# Patient Record
Sex: Female | Born: 1955
Health system: Southern US, Community
[De-identification: ages and names within clinical notes are randomized; demographics above are authoritative.]

## PROBLEM LIST (undated history)

## (undated) DIAGNOSIS — G43909 Migraine, unspecified, not intractable, without status migrainosus: Secondary | ICD-10-CM

## (undated) DIAGNOSIS — D219 Benign neoplasm of connective and other soft tissue, unspecified: Secondary | ICD-10-CM

## (undated) DIAGNOSIS — K5790 Diverticulosis of intestine, part unspecified, without perforation or abscess without bleeding: Secondary | ICD-10-CM

## (undated) DIAGNOSIS — M1990 Unspecified osteoarthritis, unspecified site: Secondary | ICD-10-CM

## (undated) HISTORY — DX: Unspecified osteoarthritis, unspecified site: M19.90

## (undated) HISTORY — DX: Benign neoplasm of connective and other soft tissue, unspecified: D21.9

## (undated) HISTORY — PX: INCISION AND DRAINAGE: SHX5863

## (undated) HISTORY — DX: Diverticulosis of intestine, part unspecified, without perforation or abscess without bleeding: K57.90

## (undated) HISTORY — DX: Migraine, unspecified, not intractable, without status migrainosus: G43.909

---

## 1990-11-05 HISTORY — PX: ECTOPIC PREGNANCY SURGERY: SHX613

## 1991-01-04 HISTORY — PX: HYSTEROSALPINGOGRAM: SHX6581

## 1997-02-03 HISTORY — PX: ABDOMINAL HYSTERECTOMY: SHX81

## 2005-06-19 ENCOUNTER — Ambulatory Visit: Payer: Self-pay | Admitting: Unknown Physician Specialty

## 2006-06-21 ENCOUNTER — Ambulatory Visit: Payer: Self-pay | Admitting: Unknown Physician Specialty

## 2007-06-24 ENCOUNTER — Ambulatory Visit: Payer: Self-pay | Admitting: Unknown Physician Specialty

## 2008-06-25 ENCOUNTER — Ambulatory Visit: Payer: Self-pay | Admitting: Unknown Physician Specialty

## 2009-07-27 ENCOUNTER — Ambulatory Visit: Payer: Self-pay | Admitting: Unknown Physician Specialty

## 2010-07-31 ENCOUNTER — Ambulatory Visit: Payer: Self-pay | Admitting: Unknown Physician Specialty

## 2010-09-01 ENCOUNTER — Ambulatory Visit: Payer: Self-pay | Admitting: Gastroenterology

## 2010-09-01 HISTORY — PX: ESOPHAGOGASTRODUODENOSCOPY: SHX1529

## 2010-09-01 HISTORY — PX: COLONOSCOPY: SHX174

## 2011-08-09 ENCOUNTER — Ambulatory Visit: Payer: Self-pay | Admitting: Unknown Physician Specialty

## 2013-08-19 ENCOUNTER — Ambulatory Visit: Payer: Self-pay

## 2016-08-08 ENCOUNTER — Other Ambulatory Visit: Payer: Self-pay | Admitting: Certified Nurse Midwife

## 2016-08-08 DIAGNOSIS — Z1231 Encounter for screening mammogram for malignant neoplasm of breast: Secondary | ICD-10-CM

## 2016-09-18 ENCOUNTER — Ambulatory Visit
Admission: RE | Admit: 2016-09-18 | Discharge: 2016-09-18 | Disposition: A | Discharge status: Home / Self Care | Payer: 59 | Source: Ambulatory Visit | Attending: Certified Nurse Midwife | Admitting: Certified Nurse Midwife

## 2016-09-18 DIAGNOSIS — Z1231 Encounter for screening mammogram for malignant neoplasm of breast: Secondary | ICD-10-CM | POA: Diagnosis not present

## 2017-08-05 DIAGNOSIS — M179 Osteoarthritis of knee, unspecified: Secondary | ICD-10-CM | POA: Insufficient documentation

## 2017-08-20 ENCOUNTER — Other Ambulatory Visit: Payer: Self-pay | Admitting: Certified Nurse Midwife

## 2017-08-20 DIAGNOSIS — Z1231 Encounter for screening mammogram for malignant neoplasm of breast: Secondary | ICD-10-CM

## 2017-10-02 ENCOUNTER — Encounter: Payer: Self-pay | Admitting: Certified Nurse Midwife

## 2017-10-02 ENCOUNTER — Ambulatory Visit (INDEPENDENT_AMBULATORY_CARE_PROVIDER_SITE_OTHER): Payer: 59 | Admitting: Certified Nurse Midwife

## 2017-10-02 ENCOUNTER — Ambulatory Visit
Admission: RE | Admit: 2017-10-02 | Discharge: 2017-10-02 | Disposition: A | Discharge status: Home / Self Care | Payer: 59 | Source: Ambulatory Visit | Attending: Certified Nurse Midwife | Admitting: Certified Nurse Midwife

## 2017-10-02 VITALS — BP 110/70 | HR 68 | Ht 62.0 in | Wt 146.0 lb

## 2017-10-02 DIAGNOSIS — Z1239 Encounter for other screening for malignant neoplasm of breast: Secondary | ICD-10-CM

## 2017-10-02 DIAGNOSIS — Z01419 Encounter for gynecological examination (general) (routine) without abnormal findings: Secondary | ICD-10-CM | POA: Diagnosis not present

## 2017-10-02 DIAGNOSIS — Z1231 Encounter for screening mammogram for malignant neoplasm of breast: Secondary | ICD-10-CM

## 2017-10-02 NOTE — Progress Notes (Signed)
Gynecology Annual Exam  PCP: Patient, No Pcp Per  Chief Complaint:  Chief Complaint  Patient presents with  . Gynecologic Exam    History of Present Illness:Kaitlyn Forbes is a 61 year old African American/Black female, [place], who presents for her annual exam. She is having no significant GYN problems.  Her menses are absent due to hysterectomy (TAH for fibroids).  She has had no spotting.   The patient's past medical history is detailed in the past medical history section.  Since her last annual GYN exam dated 09/18/2016 , she has had some problems with pain in her right knee from osteoarthritis and has stopped running for the time being. She has gained a few pounds back and her current BMI=26.70 kg/m2. Her father passed 07/08/2017 from COPD. She is sexually active. She does not have vaginal dryness.   Her most recent  mammogram obtained  09/18/2016 was negative. There is no family history of breast cancer.  There is no family history of ovarian cancer.  The patient does do monthly self breast exams.  She had a colonoscopy in 2011 that was normal. Her next colonoscopy is due in 10 years.  She had a recent DEXA scan obtained in 2014 that was normal.  The patient does not smoke.  The patient does drink occasionally.  The patient does not use illegal drugs.  The patient normally exercises regularly, but she has not exercised recently due to knee pain The patient does get adequate calcium in her diet and with her supplement  She had a recent cholesterol screen in 2018 at work  that was normal.    The patient denies current symptoms of depression.    Review of Systems: Review of Systems  Constitutional: Negative for chills, fever and weight loss.  HENT: Negative for congestion, sinus pain and sore throat.   Eyes: Negative for blurred vision and pain.  Respiratory: Negative for hemoptysis, shortness of breath and wheezing.   Cardiovascular: Negative for chest pain,  palpitations and leg swelling.  Gastrointestinal: Negative for abdominal pain, blood in stool, diarrhea, heartburn, nausea and vomiting.  Genitourinary: Negative for dysuria, frequency, hematuria and urgency.  Musculoskeletal: Positive for joint pain (knee pain). Negative for back pain and myalgias.  Skin: Negative for itching and rash.  Neurological: Negative for dizziness, tingling and headaches.  Endo/Heme/Allergies: Negative for environmental allergies and polydipsia. Does not bruise/bleed easily.       Negative for hirsutism   Psychiatric/Behavioral: Negative for depression. The patient is not nervous/anxious and does not have insomnia.     Past Medical History:  Past Medical History:  Diagnosis Date  . Fibroids   . Osteoarthritis    right knee    Past Surgical History:  Past Surgical History:  Procedure Laterality Date  . ABDOMINAL HYSTERECTOMY  02/1997   TAH for fibroids/ Dr Rayford Halsted  . COLONOSCOPY  09/01/2010   diverticulosis  . ECTOPIC PREGNANCY SURGERY Left 11/1990   with left salpingectomy  . ESOPHAGOGASTRODUODENOSCOPY  09/01/2010   Dr Donnella Sham. Prior EGD for anemia work up by Dr Nicolasa Ducking.  Marland Kitchen HYSTEROSALPINGOGRAM  01/1991   distal obstruction of right Fallopian tube  . INCISION AND DRAINAGE     left bartholin cyst/ Word catheter    Family History:  Family History  Problem Relation Age of Onset  . Hypertension Mother   . Diabetes Father   . COPD Father   . Breast cancer Neg Hx     Social History:  Social History   Socioeconomic History  . Marital status: Married    Spouse name: Rosanna Randy  . Number of children: 1  . Years of education: Not on file  . Highest education level: Bachelor's degree (e.g., BA, AB, BS)  Social Needs  . Financial resource strain: Not on file  . Food insecurity - worry: Not on file  . Food insecurity - inability: Not on file  . Transportation needs - medical: Not on file  . Transportation needs - non-medical: Not on file    Occupational History  . Occupation: Fish farm manager  Tobacco Use  . Smoking status: Never Smoker  . Smokeless tobacco: Never Used  Substance and Sexual Activity  . Alcohol use: Yes    Comment: occasional wine  . Drug use: No  . Sexual activity: Yes    Partners: Male    Birth control/protection: Surgical  Other Topics Concern  . Not on file  Social History Narrative   Patient's daughter currently living in [place], [place]    Allergies:  Allergies  Allergen Reactions  . Aspirin     Medications: Current Outpatient Medications:  .  Ascorbic Acid (VITAMIN C) 1000 MG tablet, Take 1,000 mg by mouth daily., Disp: , Rfl:  .  Biotin 1 MG CAPS, Take by mouth., Disp: , Rfl:  .  Calcium-Vitamin D-Vitamin K (VIACTIV) 761-607-37 MG-UNT-MCG CHEW, Chew by mouth., Disp: , Rfl:  .  Multiple Vitamin (MULTIVITAMIN) tablet, Take 1 tablet by mouth daily., Disp: , Rfl:  .  Omega-3 Fatty Acids (FISH OIL) 1000 MG CAPS, Take by mouth., Disp: , Rfl:  .  Probiotic Product (TRUBIOTICS PO), Take by mouth., Disp: , Rfl:  Physical Exam Vitals: BP 110/70   Pulse 68   Ht 5\' 2"  (1.575 m)   Wt 146 lb (66.2 kg)   BMI 26.70 kg/m   General: pleasant AAF in NAD HEENT: normocephalic, anicteric Neck: no thyroid enlargement, no palpable nodules, no cervical lymphadenopathy  Pulmonary: No increased work of breathing, CTAB Cardiovascular: RRR, without murmur  Breast: Breast symmetrical, no tenderness, no palpable nodules or masses, no skin or nipple retraction present, no nipple discharge.  No axillary, infraclavicular or supraclavicular lymphadenopathy. Abdomen: Soft, non-tender, non-distended.  Umbilicus without lesions.  No hepatomegaly or masses palpable. No evidence of hernia. Genitourinary:  External: Normal external female genitalia.  Normal urethral meatus, normal Bartholin's and Skene's glands.    Vagina: Normal vaginal mucosa, no evidence of prolapse.    Cervix: surgically absent  Uterus: surgically  absent  Adnexa: No adnexal masses, non-tender  Rectal: deferred  Lymphatic: no evidence of inguinal lymphadenopathy Extremities: no edema, erythema, or tenderness Neurologic: Grossly intact Psychiatric: mood appropriate, affect full     Assessment: 61 y.o. annual gyn exam   Plan:   1) Breast cancer screening - recommend monthly self breast exam and annual screening mammograms. Mammogram was ordered today. She has her mammogram scheduled at [facility] today  2) Colon cancer screening UTD-next due in 2021  3) Pap not indicated s/p TAH  4)Osteoporosis prevention: calcium and vit D supplements and exercise. Next Dexa due at age 76  5) Routine healthcare maintenance including cholesterol and diabetes screening UTD   6) RTO 1 year and prn  Dalia Heading, CNM

## 2017-10-14 ENCOUNTER — Encounter: Payer: Self-pay | Admitting: Certified Nurse Midwife

## 2017-10-14 DIAGNOSIS — M1990 Unspecified osteoarthritis, unspecified site: Secondary | ICD-10-CM | POA: Insufficient documentation

## 2018-09-02 ENCOUNTER — Other Ambulatory Visit: Payer: Self-pay | Admitting: Certified Nurse Midwife

## 2018-09-02 DIAGNOSIS — Z1231 Encounter for screening mammogram for malignant neoplasm of breast: Secondary | ICD-10-CM

## 2018-10-17 ENCOUNTER — Ambulatory Visit: Payer: 59 | Admitting: Certified Nurse Midwife

## 2018-10-26 NOTE — Progress Notes (Signed)
Gynecology Annual Exam  PCP: Dalia Heading, CNM  Chief Complaint:  Chief Complaint  Patient presents with  . Gynecologic Exam    History of Present Illness:Kaitlyn Forbes is a 62 year old African American/Black female, [place], who presents for her annual exam. She is having no significant GYN problems.  Her menses are absent due to hysterectomy (TAH for fibroids).  She has had no spotting. Has hot flashes at night if she drinks tea before she goes to bed.  The patient's past medical history is detailed in the past medical history section.  Since her last annual GYN exam dated 10/02/2017 , she has had no significant changes in her health. She is sexually active. She does not have vaginal dryness.   Her mammogram obtained  10/02/2017 was negative. She had a mammogram today and results are pending There is no family history of breast cancer.  There is no family history of ovarian cancer.  The patient does do monthly self breast exams.  She had a colonoscopy in 2011 that was normal. Her next colonoscopy is due in 10 years.  She had a recent DEXA scan obtained in 2014 that was normal.  The patient does not smoke.  The patient does drink occasionally.  The patient does not use illegal drugs.  The patient normally exercises regularly-goes to [facility] 3x/day. The patient does get adequate calcium in her diet and with her supplement  She had a recent cholesterol screen in 2019 at work  that was normal.    The patient denies current symptoms of depression.    Review of Systems: Review of Systems  Constitutional: Negative for chills, fever and weight loss.  HENT: Negative for congestion, sinus pain and sore throat.   Eyes: Negative for blurred vision and pain.  Respiratory: Negative for hemoptysis, shortness of breath and wheezing.   Cardiovascular: Negative for chest pain, palpitations and leg swelling.  Gastrointestinal: Negative for abdominal pain, blood in stool,  diarrhea, heartburn, nausea and vomiting.  Genitourinary: Negative for dysuria, frequency, hematuria and urgency.  Musculoskeletal: Negative for back pain, joint pain (knee pain) and myalgias.  Skin: Negative for itching and rash.  Neurological: Negative for dizziness, tingling and headaches.  Endo/Heme/Allergies: Negative for environmental allergies and polydipsia. Does not bruise/bleed easily.          Psychiatric/Behavioral: Negative for depression. The patient is not nervous/anxious and does not have insomnia.     Past Medical History:  Past Medical History:  Diagnosis Date  . Diverticulosis   . Fibroids   . Osteoarthritis    right knee    Past Surgical History:  Past Surgical History:  Procedure Laterality Date  . ABDOMINAL HYSTERECTOMY  02/1997   TAH for fibroids/ Dr Rayford Halsted  . COLONOSCOPY  09/01/2010   diverticulosis  . ECTOPIC PREGNANCY SURGERY Left 11/1990   with left salpingectomy  . ESOPHAGOGASTRODUODENOSCOPY  09/01/2010   Dr Donnella Sham. Prior EGD for anemia work up by Dr Nicolasa Ducking.  Marland Kitchen HYSTEROSALPINGOGRAM  01/1991   distal obstruction of right Fallopian tube  . INCISION AND DRAINAGE     left bartholin cyst/ Word catheter    Family History:  Family History  Problem Relation Age of Onset  . Hypertension Mother   . Diabetes Father   . COPD Father   . Breast cancer Neg Hx     Social History:  Social History   Socioeconomic History  . Marital status: Married    Spouse name: Rosanna Randy  .  Number of children: 1  . Years of education: Not on file  . Highest education level: Bachelor's degree (e.g., BA, AB, BS)  Occupational History  . Occupation: Fish farm manager  Social Needs  . Financial resource strain: Not on file  . Food insecurity:    Worry: Not on file    Inability: Not on file  . Transportation needs:    Medical: Not on file    Non-medical: Not on file  Tobacco Use  . Smoking status: Never Smoker  . Smokeless tobacco: Never Used  Substance and  Sexual Activity  . Alcohol use: Yes    Comment: occasional wine  . Drug use: No  . Sexual activity: Yes    Partners: Male    Birth control/protection: Surgical  Lifestyle  . Physical activity:    Days per week: 3 days    Minutes per session: 60 min  . Stress: Not at all  Relationships  . Social connections:    Talks on phone: More than three times a week    Gets together: More than three times a week    Attends religious service: More than 4 times per year    Active member of club or organization: No    Attends meetings of clubs or organizations: Never    Relationship status: Married  . Intimate partner violence:    Fear of current or ex partner: No    Emotionally abused: No    Physically abused: No    Forced sexual activity: No  Other Topics Concern  . Not on file  Social History Narrative   Patient's daughter currently living in [place], [place]    Allergies:  Allergies  Allergen Reactions  . Aspirin     Medications: Current Outpatient Medications:  .  Ascorbic Acid (VITAMIN C) 1000 MG tablet, Take 1,000 mg by mouth daily., Disp: , Rfl:  .  Biotin 1 MG CAPS, Take by mouth., Disp: , Rfl:  .  Calcium-Vitamin D-Vitamin K (VIACTIV) 542-706-23 MG-UNT-MCG CHEW, Chew by mouth., Disp: , Rfl:  .  Multiple Vitamin (MULTIVITAMIN) tablet, Take 1 tablet by mouth daily., Disp: , Rfl:  .  Omega-3 Fatty Acids (FISH OIL) 1000 MG CAPS, Take by mouth., Disp: , Rfl:  .  Probiotic Product (TRUBIOTICS PO), Take by mouth., Disp: , Rfl:  Physical Exam Vitals: BP 110/60   Pulse 81   Ht 5\' 2"  (1.575 m)   Wt 151 lb (68.5 kg)   BMI 27.62 kg/m   General: pleasant AAF in NAD HEENT: normocephalic, anicteric Neck: no thyroid enlargement, no palpable nodules, no cervical lymphadenopathy  Pulmonary: No increased work of breathing, CTAB Cardiovascular: RRR, without murmur  Breast: Breast symmetrical, no tenderness, no palpable nodules or masses, no skin or nipple retraction present, no nipple  discharge.  No axillary, infraclavicular or supraclavicular lymphadenopathy. Abdomen: Soft, non-tender, non-distended.  Umbilicus without lesions.  No hepatomegaly or masses palpable. No evidence of hernia. Genitourinary:  External: Normal external female genitalia.  Normal urethral meatus, normal Bartholin's and Skene's glands.    Vagina: Normal vaginal mucosa, no evidence of prolapse, flattened rugae.    Cervix: surgically absent  Uterus: surgically absent  Adnexa: No adnexal masses, non-tender  Rectal: deferred  Lymphatic: no evidence of inguinal lymphadenopathy Extremities: no edema, erythema, or tenderness Neurologic: Grossly intact Psychiatric: mood appropriate, affect full     Assessment: 62 y.o. annual gyn exam   Plan:   1) Breast cancer screening - recommend monthly self breast exam and annual screening  mammograms. She had her mammogram at [facility] today  2) Colon cancer screening UTD-next due in 2021  3) Pap not indicated s/p TAH  4)Osteoporosis prevention: calcium and vit D supplements and exercise. Next Dexa due at age 36  5) Routine healthcare maintenance including cholesterol and diabetes screening UTD   6) RTO 1 year and prn  Dalia Heading, CNM

## 2018-10-27 ENCOUNTER — Ambulatory Visit
Admission: RE | Admit: 2018-10-27 | Discharge: 2018-10-27 | Disposition: A | Discharge status: Home / Self Care | Payer: 59 | Source: Ambulatory Visit | Attending: Certified Nurse Midwife | Admitting: Certified Nurse Midwife

## 2018-10-27 ENCOUNTER — Encounter: Payer: Self-pay | Admitting: Certified Nurse Midwife

## 2018-10-27 ENCOUNTER — Ambulatory Visit (INDEPENDENT_AMBULATORY_CARE_PROVIDER_SITE_OTHER): Payer: 59 | Admitting: Certified Nurse Midwife

## 2018-10-27 VITALS — BP 110/60 | HR 81 | Ht 62.0 in | Wt 151.0 lb

## 2018-10-27 DIAGNOSIS — Z01419 Encounter for gynecological examination (general) (routine) without abnormal findings: Secondary | ICD-10-CM

## 2018-10-27 DIAGNOSIS — Z1231 Encounter for screening mammogram for malignant neoplasm of breast: Secondary | ICD-10-CM | POA: Diagnosis present

## 2018-11-04 ENCOUNTER — Encounter: Payer: Self-pay | Admitting: Certified Nurse Midwife

## 2019-06-08 ENCOUNTER — Other Ambulatory Visit: Payer: Self-pay

## 2019-06-08 DIAGNOSIS — Z20822 Contact with and (suspected) exposure to covid-19: Secondary | ICD-10-CM

## 2019-06-10 LAB — NOVEL CORONAVIRUS, NAA: SARS-CoV-2, NAA: NOT DETECTED

## 2019-08-24 ENCOUNTER — Other Ambulatory Visit: Payer: Self-pay

## 2019-08-24 DIAGNOSIS — Z20822 Contact with and (suspected) exposure to covid-19: Secondary | ICD-10-CM

## 2019-08-25 LAB — NOVEL CORONAVIRUS, NAA: SARS-CoV-2, NAA: NOT DETECTED

## 2019-09-23 ENCOUNTER — Other Ambulatory Visit: Payer: Self-pay | Admitting: Certified Nurse Midwife

## 2019-09-23 DIAGNOSIS — Z1231 Encounter for screening mammogram for malignant neoplasm of breast: Secondary | ICD-10-CM

## 2019-11-02 NOTE — Progress Notes (Signed)
Gynecology Annual Exam  PCP: Dalia Heading, CNM  Chief Complaint:  Chief Complaint  Patient presents with  . Gynecologic Exam    History of Present Illness:Kaitlyn Forbes is a 63 year old African American/Black female, [place], who presents for her annual exam. She is having no significant GYN problems.  Her menses are absent due to hysterectomy (TAH for fibroids).  She has had no spotting. Continues to have hot flashes.  The patient's past medical history is detailed in the past medical history section.  Since her last annual GYN exam dated 10/27/2018, she has had no significant changes in her health. Has gained 12# during the pandemic. She is sexually active. She does not have vaginal dryness.   Her mammogram obtained 10/27/2018 was negative. She has a mammogram scheduled for today There is no family history of breast cancer.  There is no family history of ovarian cancer.  The patient does do monthly self breast exams.  She had a colonoscopy in 2011 that was normal. Her next colonoscopy is due next year  She had a recent DEXA scan obtained in 2014 that was normal.  The patient does not smoke.  The patient does drink wine occasionally.  The patient does not use illegal drugs.  The patient normally exercises regularly-does cardio 2-3x/week since the gyms closed. The patient does get adequate calcium in her diet and with her supplement  She had a recent cholesterol screen in 2019 at work  that was normal.    The patient denies current symptoms of depression.    Review of Systems: Review of Systems  Constitutional: Negative for chills, fever and weight loss.  HENT: Negative for congestion, sinus pain and sore throat.   Eyes: Negative for blurred vision and pain.  Respiratory: Negative for hemoptysis, shortness of breath and wheezing.   Cardiovascular: Negative for chest pain, palpitations and leg swelling.  Gastrointestinal: Negative for abdominal pain, blood  in stool, diarrhea, heartburn, nausea and vomiting.  Genitourinary: Negative for dysuria, frequency, hematuria and urgency.  Musculoskeletal: Negative for back pain, joint pain (knee pain) and myalgias.  Skin: Negative for itching and rash.  Neurological: Negative for dizziness, tingling and headaches.  Endo/Heme/Allergies: Negative for environmental allergies and polydipsia. Does not bruise/bleed easily.          Psychiatric/Behavioral: Negative for depression. The patient is not nervous/anxious and does not have insomnia.     Past Medical History:  Past Medical History:  Diagnosis Date  . Diverticulosis   . Fibroids   . Osteoarthritis    right knee    Past Surgical History:  Past Surgical History:  Procedure Laterality Date  . ABDOMINAL HYSTERECTOMY  02/1997   TAH for fibroids/ Dr Rayford Halsted  . COLONOSCOPY  09/01/2010   diverticulosis  . ECTOPIC PREGNANCY SURGERY Left 11/1990   with left salpingectomy  . ESOPHAGOGASTRODUODENOSCOPY  09/01/2010   Dr Donnella Sham. Prior EGD for anemia work up by Dr Nicolasa Ducking.  Marland Kitchen HYSTEROSALPINGOGRAM  01/1991   distal obstruction of right Fallopian tube  . INCISION AND DRAINAGE     left bartholin cyst/ Word catheter    Family History:  Family History  Problem Relation Age of Onset  . Hypertension Mother   . Diabetes Father   . COPD Father   . Breast cancer Neg Hx     Social History:  Social History   Socioeconomic History  . Marital status: Married    Spouse name: Rosanna Randy  . Number of children:  1  . Years of education: Not on file  . Highest education level: Bachelor's degree (e.g., BA, AB, BS)  Occupational History  . Occupation: Fish farm manager  Tobacco Use  . Smoking status: Never Smoker  . Smokeless tobacco: Never Used  Substance and Sexual Activity  . Alcohol use: Yes    Comment: occasional wine  . Drug use: No  . Sexual activity: Yes    Partners: Male    Birth control/protection: Surgical  Other Topics Concern  . Not on file   Social History Narrative   Patient's daughter currently living in [place], [place]   Social Determinants of Health   Financial Resource Strain:   . Difficulty of Paying Living Expenses: Not on file  Food Insecurity:   . Worried About Charity fundraiser in the Last Year: Not on file  . Ran Out of Food in the Last Year: Not on file  Transportation Needs:   . Lack of Transportation (Medical): Not on file  . Lack of Transportation (Non-Medical): Not on file  Physical Activity:   . Days of Exercise per Week: Not on file  . Minutes of Exercise per Session: Not on file  Stress:   . Feeling of Stress : Not on file  Social Connections:   . Frequency of Communication with Friends and Family: Not on file  . Frequency of Social Gatherings with Friends and Family: Not on file  . Attends Religious Services: Not on file  . Active Member of Clubs or Organizations: Not on file  . Attends Archivist Meetings: Not on file  . Marital Status: Not on file  Intimate Partner Violence:   . Fear of Current or Ex-Partner: Not on file  . Emotionally Abused: Not on file  . Physically Abused: Not on file  . Sexually Abused: Not on file    Allergies:  Allergies  Allergen Reactions  . Aspirin     Medications: Current Outpatient Medications:  .  Ascorbic Acid (VITAMIN C) 1000 MG tablet, Take 1,000 mg by mouth daily., Disp: , Rfl:  .  Biotin 1 MG CAPS, Take by mouth., Disp: , Rfl:  .  Calcium-Vitamin D-Vitamin K (VIACTIV) [id number] MG-UNT-MCG CHEW, Chew by mouth., Disp: , Rfl:  .  Multiple Vitamin (MULTIVITAMIN) tablet, Take 1 tablet by mouth daily., Disp: , Rfl:  .  Probiotic Product (TRUBIOTICS PO), Take by mouth., Disp: , Rfl:   Physical Exam Vitals: BP 130/80   Ht 5\' 2"  (1.575 m)   Wt 163 lb (73.9 kg)   BMI 29.81 kg/m   General: pleasant AAF in NAD HEENT: normocephalic, anicteric Neck: no thyroid enlargement, no palpable nodules, no cervical lymphadenopathy  Pulmonary: No  increased work of breathing, CTAB Cardiovascular: RRR, without murmur  Breast: Breast symmetrical, no tenderness, no palpable nodules or masses, no skin or nipple retraction present, no nipple discharge.  No axillary, infraclavicular or supraclavicular lymphadenopathy. Abdomen: Soft, non-tender, non-distended.  Umbilicus without lesions.  No hepatomegaly or masses palpable. No evidence of hernia. Genitourinary:  External: Normal external female genitalia.  Normal urethral meatus, normal Bartholin's and Skene's glands.    Vagina: Normal vaginal mucosa, no evidence of prolapse, flattened rugae.    Cervix: surgically absent  Uterus: surgically absent  Adnexa: No adnexal masses, non-tender  Rectal: deferred  Lymphatic: no evidence of inguinal lymphadenopathy Extremities: no edema, erythema, or tenderness Neurologic: Grossly intact Psychiatric: mood appropriate, affect full     Assessment: 63 y.o. annual gyn exam   Plan:  1) Breast cancer screening - recommend monthly self breast exam and annual screening mammograms. She has her mammogram scheduled at [facility] today  2) Colon cancer screening UTD-next due in 2021  3) Pap not indicated s/p TAH  4)Osteoporosis prevention: calcium and vit D supplements and exercise. Next Dexa due at age 63  5) Routine healthcare maintenance including cholesterol and diabetes screening UTD   6) RTO 1 year and prn  Dalia Heading, CNM

## 2019-11-03 ENCOUNTER — Ambulatory Visit (INDEPENDENT_AMBULATORY_CARE_PROVIDER_SITE_OTHER): Payer: 59 | Admitting: Certified Nurse Midwife

## 2019-11-03 ENCOUNTER — Other Ambulatory Visit: Payer: Self-pay

## 2019-11-03 ENCOUNTER — Encounter: Payer: Self-pay | Admitting: Certified Nurse Midwife

## 2019-11-03 ENCOUNTER — Ambulatory Visit
Admission: RE | Admit: 2019-11-03 | Discharge: 2019-11-03 | Disposition: A | Discharge status: Home / Self Care | Payer: 59 | Source: Ambulatory Visit | Attending: Certified Nurse Midwife | Admitting: Certified Nurse Midwife

## 2019-11-03 VITALS — BP 130/80 | Ht 62.0 in | Wt 163.0 lb

## 2019-11-03 DIAGNOSIS — Z1239 Encounter for other screening for malignant neoplasm of breast: Secondary | ICD-10-CM

## 2019-11-03 DIAGNOSIS — Z01419 Encounter for gynecological examination (general) (routine) without abnormal findings: Secondary | ICD-10-CM | POA: Diagnosis not present

## 2019-11-03 DIAGNOSIS — Z1231 Encounter for screening mammogram for malignant neoplasm of breast: Secondary | ICD-10-CM

## 2020-06-13 ENCOUNTER — Other Ambulatory Visit: Payer: Self-pay

## 2020-06-15 ENCOUNTER — Encounter: Payer: Self-pay | Admitting: Nurse Practitioner

## 2020-06-15 ENCOUNTER — Ambulatory Visit: Payer: 59 | Admitting: Nurse Practitioner

## 2020-06-15 ENCOUNTER — Other Ambulatory Visit: Payer: Self-pay

## 2020-06-15 VITALS — BP 120/78 | HR 64 | Temp 98.3°F | Ht 62.0 in | Wt 165.0 lb

## 2020-06-15 DIAGNOSIS — Z114 Encounter for screening for human immunodeficiency virus [HIV]: Secondary | ICD-10-CM

## 2020-06-15 DIAGNOSIS — G43009 Migraine without aura, not intractable, without status migrainosus: Secondary | ICD-10-CM | POA: Diagnosis not present

## 2020-06-15 DIAGNOSIS — Z1159 Encounter for screening for other viral diseases: Secondary | ICD-10-CM

## 2020-06-15 DIAGNOSIS — Z1211 Encounter for screening for malignant neoplasm of colon: Secondary | ICD-10-CM

## 2020-06-15 DIAGNOSIS — Z0000 Encounter for general adult medical examination without abnormal findings: Secondary | ICD-10-CM

## 2020-06-15 HISTORY — DX: Encounter for screening for human immunodeficiency virus (HIV): Z11.4

## 2020-06-15 NOTE — Progress Notes (Addendum)
New Patient Office Visit  Subjective:  Patient ID: Kaitlyn Forbes, female    DOB: 31-Jul-1956  Age: 64 y.o. MRN: [id number]  CC:  Chief Complaint  Patient presents with  . New Patient (Initial Visit)    establish care/migraines    HPI Kaitlyn Forbes is a 64 year old with history of arthritis, migraines, diverticulosis, uterine fibroids  presents to establish care with a primary care provider.   She is followed by [facility] and her  mammo and pap UTD Dec 2020- normal. Partial hysterectomy- one ovary remaining. Lab studies showed menopause a few years ago.   Migraines: Started migraines in early 20's and treated with Midrin. She had bad migraines in her 20's- once a week. No neurology consult or MRI.She grew out of them over the years.   Started to get migraines again- every week for the last month. They are the same as in the past- nothing new. Not positional. Only aura would be queasy stomach.  No visual changes.  Headaches typically are above the forehead, described as throbbing and pounding, with positive photosensitivity.  She can resolve it with Aleve x2 if she catches it early enough. Otherwise, she has to go into a dark quiet room and sleep it off.  No vomiting. Slight nausea during and the following day. She can't eat.  She has not tried Excedrin migraine or magnesium.  She most  had a HA last Monday late morning and took Aleve 2 and the continued all day and nausea, no vomiting and went to bed at 5 pm slept until morning. She woke up- HA free but felt a little blah in the morning. Resolved. She woke up with another HA on Wed - mild and took Aleve x2 which made it tolerable and was able to function normally through the day. It was gone the next morning.   She can recall no triggers. Nothing new- working from home. Goes to the gym TI[employer]- x years. Does weights and aerobic. Drinks one cup of coffee in the morning. Occasional glass of wine.    Immunizations:No record of Tdap,  shingrix, will need flu Diet:healthy Exercise:GYM  Colonoscopy:due this year Dexa: Pap Smear: GYN follows UTD Mammogram:GYN due 10/2020   Past Medical History:  Diagnosis Date  . Diverticulosis   . Fibroids   . Migraines   . Osteoarthritis    right knee    Past Surgical History:  Procedure Laterality Date  . ABDOMINAL HYSTERECTOMY  02/1997   TAH for fibroids/ Dr Rayford Halsted  . COLONOSCOPY  09/01/2010   diverticulosis  . ECTOPIC PREGNANCY SURGERY Left 11/1990   with left salpingectomy  . ESOPHAGOGASTRODUODENOSCOPY  09/01/2010   Dr Donnella Sham. Prior EGD for anemia work up by Dr Nicolasa Ducking.  Marland Kitchen HYSTEROSALPINGOGRAM  01/1991   distal obstruction of right Fallopian tube  . INCISION AND DRAINAGE     left bartholin cyst/ Word catheter    Family History  Problem Relation Age of Onset  . Hypertension Mother   . Arthritis Mother   . Diabetes Mother   . Diabetes Father   . COPD Father   . Breast cancer Neg Hx     Social History   Socioeconomic History  . Marital status: Married    Spouse name: Rosanna Randy  . Number of children: 1  . Years of education: Not on file  . Highest education level: Bachelor's degree (e.g., BA, AB, BS)  Occupational History  . Occupation: Fish farm manager  Tobacco Use  . Smoking status:  Never Smoker  . Smokeless tobacco: Never Used  Vaping Use  . Vaping Use: Never used  Substance and Sexual Activity  . Alcohol use: Yes    Comment: occasional wine  . Drug use: No  . Sexual activity: Yes    Partners: Male    Birth control/protection: Surgical  Other Topics Concern  . Not on file  Social History Narrative   Patient's daughter currently living in [place], [place]   Social Determinants of Health   Financial Resource Strain:   . Difficulty of Paying Living Expenses:   Food Insecurity:   . Worried About Charity fundraiser in the Last Year:   . Arboriculturist in the Last Year:   Transportation Needs:   . Film/video editor (Medical):   Marland Kitchen Lack of  Transportation (Non-Medical):   Physical Activity:   . Days of Exercise per Week:   . Minutes of Exercise per Session:   Stress:   . Feeling of Stress :   Social Connections:   . Frequency of Communication with Friends and Family:   . Frequency of Social Gatherings with Friends and Family:   . Attends Religious Services:   . Active Member of Clubs or Organizations:   . Attends Archivist Meetings:   Marland Kitchen Marital Status:   Intimate Partner Violence:   . Fear of Current or Ex-Partner:   . Emotionally Abused:   Marland Kitchen Physically Abused:   . Sexually Abused:     ROS Review of Systems  Constitutional: Negative.   HENT: Negative.   Eyes: Negative.   Respiratory: Negative for cough and shortness of breath.   Cardiovascular: Negative for chest pain, palpitations and leg swelling.  Gastrointestinal: Negative.   Endocrine: Negative.   Genitourinary: Negative.   Musculoskeletal: Negative.   Skin: Negative.   Allergic/Immunologic: Negative.   Neurological: Positive for headaches. Negative for dizziness, tremors, seizures, syncope, facial asymmetry, speech difficulty, weakness, light-headedness and numbness.  Hematological: Negative for adenopathy.  Psychiatric/Behavioral:       No concerns with depression and anxiety    Objective:   Today's Vitals: BP 120/78 (BP Location: Left Arm, Patient Position: Sitting, Cuff Size: Normal)   Pulse 64   Temp 98.3 F (36.8 C) (Oral)   Ht 5\' 2"  (1.575 m)   Wt 165 lb (74.8 kg)   SpO2 97%   BMI 30.18 kg/m   Physical Exam Vitals reviewed.  Constitutional:      Appearance: Normal appearance.  HENT:     Head: Normocephalic.  Eyes:     Conjunctiva/sclera: Conjunctivae normal.     Pupils: Pupils are equal, round, and reactive to light.  Cardiovascular:     Rate and Rhythm: Normal rate and regular rhythm.     Pulses: Normal pulses.     Heart sounds: Normal heart sounds.  Pulmonary:     Effort: Pulmonary effort is normal.     Breath  sounds: Normal breath sounds.  Abdominal:     Palpations: Abdomen is soft.     Tenderness: There is no abdominal tenderness.  Musculoskeletal:        General: Normal range of motion.     Cervical back: Normal range of motion and neck supple.  Skin:    General: Skin is warm and dry.  Neurological:     General: No focal deficit present.     Mental Status: She is alert and oriented to person, place, and time.     Cranial Nerves: No cranial  nerve deficit.     Sensory: No sensory deficit.     Motor: No weakness.     Coordination: Coordination normal.     Gait: Gait normal.  Psychiatric:        Mood and Affect: Mood normal.        Behavior: Behavior normal.     Assessment & Plan:   Problem List Items Addressed This Visit      Cardiovascular and Mediastinum   Migraine without aura and without status migrainosus, not intractable   Relevant Orders   B12 and Folate Panel   VITAMIN D 25 Hydroxy (Vit-D Deficiency, Fractures)     Other   Encounter for medical examination to establish care   Relevant Orders   CBC with Differential/Platelet   Comprehensive metabolic panel   Hemoglobin A1c   Lipid panel   TSH   Screening for HIV (human immunodeficiency virus)   Relevant Orders   HIV Antibody (routine testing w rflx)   Encounter for HCV screening test for low risk patient   Relevant Orders   Hepatitis C antibody   Colon cancer screening - Primary   Relevant Orders   Ambulatory referral to Gastroenterology      Outpatient Encounter Medications as of 06/15/2020  Medication Sig  . Ascorbic Acid (VITAMIN C) 1000 MG tablet Take 1,000 mg by mouth daily.  . Biotin 1 MG CAPS Take by mouth.  . Calcium-Vitamin D-Vitamin K (VIACTIV) [id number] MG-UNT-MCG CHEW Chew by mouth.  . Multiple Vitamin (MULTIVITAMIN) tablet Take 1 tablet by mouth daily.  . Probiotic Product (TRUBIOTICS PO) Take by mouth.   No facility-administered encounter medications on file as of 06/15/2020.   No recent  labs in the system. Routine CPE labs ordered. She reports her HA are typical  for her, but new onset. She has not had a migraine for years and now she is getting them over the last month and had 2 in the last week. Neurological exam was normal.   I will call with lab results and make further recommendations for your migraine concern. 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   I have placed a referral in for your screening colonoscopy due in Oct.   Please make a follow- up for complete physical  exam without PAP in the next month.   Follow-up: No follow-ups on file.  This visit occurred during the SARS-CoV-2 public health emergency.  Safety protocols were in place, including screening questions prior to the visit, additional usage of staff PPE, and extensive cleaning of exam room while observing appropriate contact time as indicated for disinfecting solutions.   Denice Paradise, NP

## 2020-06-15 NOTE — Patient Instructions (Addendum)
Please get routine lab work done.   I will call with results and make further recommendations for your migraine concern.   I have placed a referral in for your screening colonoscopy due in Oct.   Please make a follow- up for complete physical  exam without PAP in the next month.   Migraine Headache A migraine headache is an intense, throbbing pain on one side or both sides of the head. Migraine headaches may also cause other symptoms, such as nausea, vomiting, and sensitivity to light and noise. A migraine headache can last from 4 hours to 3 days. Talk with your doctor about what things may bring on (trigger) your migraine headaches. What are the causes? The exact cause of this condition is not known. However, a migraine may be caused when nerves in the brain become irritated and release chemicals that cause inflammation of blood vessels. This inflammation causes pain. This condition may be triggered or caused by:  Drinking alcohol.  Smoking.  Taking medicines, such as: ? Medicine used to treat chest pain (nitroglycerin). ? Birth control pills. ? Estrogen. ? Certain blood pressure medicines.  Eating or drinking products that contain nitrates, glutamate, aspartame, or tyramine. Aged cheeses, chocolate, or caffeine may also be triggers.  Doing physical activity. Other things that may trigger a migraine headache include:  Menstruation.  Pregnancy.  Hunger.  Stress.  Lack of sleep or too much sleep.  Weather changes.  Fatigue. What increases the risk? The following factors may make you more likely to experience migraine headaches:  Being a certain age. This condition is more common in people who are 51-36 years old.  Being female.  Having a family history of migraine headaches.  Being Caucasian.  Having a mental health condition, such as depression or anxiety.  Being obese. What are the signs or symptoms? The main symptom of this condition is pulsating or throbbing  pain. This pain may:  Happen in any area of the head, such as on one side or both sides.  Interfere with daily activities.  Get worse with physical activity.  Get worse with exposure to bright lights or loud noises. Other symptoms may include:  Nausea.  Vomiting.  Dizziness.  General sensitivity to bright lights, loud noises, or smells. Before you get a migraine headache, you may get warning signs (an aura). An aura may include:  Seeing flashing lights or having blind spots.  Seeing bright spots, halos, or zigzag lines.  Having tunnel vision or blurred vision.  Having numbness or a tingling feeling.  Having trouble talking.  Having muscle weakness. Some people have symptoms after a migraine headache (postdromal phase), such as:  Feeling tired.  Difficulty concentrating. How is this diagnosed? A migraine headache can be diagnosed based on:  Your symptoms.  A physical exam.  Tests, such as: ? CT scan or an MRI of the head. These imaging tests can help rule out other causes of headaches. ? Taking fluid from the spine (lumbar puncture) and analyzing it (cerebrospinal fluid analysis, or CSF analysis). How is this treated? This condition may be treated with medicines that:  Relieve pain.  Relieve nausea.  Prevent migraine headaches. Treatment for this condition may also include:  Acupuncture.  Lifestyle changes like avoiding foods that trigger migraine headaches.  Biofeedback.  Cognitive behavioral therapy. Follow these instructions at home: Medicines  Take over-the-counter and prescription medicines only as told by your health care provider.  Ask your health care provider if the medicine prescribed to you: ?  Requires you to avoid driving or using heavy machinery. ? Can cause constipation. You may need to take these actions to prevent or treat constipation:  Drink enough fluid to keep your urine pale yellow.  Take over-the-counter or prescription  medicines.  Eat foods that are high in fiber, such as beans, whole grains, and fresh fruits and vegetables.  Limit foods that are high in fat and processed sugars, such as fried or sweet foods. Lifestyle  Do not drink alcohol.  Do not use any products that contain nicotine or tobacco, such as cigarettes, e-cigarettes, and chewing tobacco. If you need help quitting, ask your health care provider.  Get at least 8 hours of sleep every night.  Find ways to manage stress, such as meditation, deep breathing, or yoga. General instructions      Keep a journal to find out what may trigger your migraine headaches. For example, write down: ? What you eat and drink. ? How much sleep you get. ? Any change to your diet or medicines.  If you have a migraine headache: ? Avoid things that make your symptoms worse, such as bright lights. ? It may help to lie down in a dark, quiet room. ? Do not drive or use heavy machinery. ? Ask your health care provider what activities are safe for you while you are experiencing symptoms.  Keep all follow-up visits as told by your health care provider. This is important. Contact a health care provider if:  You develop symptoms that are different or more severe than your usual migraine headache symptoms.  You have more than 15 headache days in one month. Get help right away if:  Your migraine headache becomes severe.  Your migraine headache lasts longer than 72 hours.  You have a fever.  You have a stiff neck.  You have vision loss.  Your muscles feel weak or like you cannot control them.  You start to lose your balance often.  You have trouble walking.  You faint.  You have a seizure. Summary  A migraine headache is an intense, throbbing pain on one side or both sides of the head. Migraines may also cause other symptoms, such as nausea, vomiting, and sensitivity to light and noise.  This condition may be treated with medicines and  lifestyle changes. You may also need to avoid certain things that trigger a migraine headache.  Keep a journal to find out what may trigger your migraine headaches.  Contact your health care provider if you have more than 15 headache days in a month or you develop symptoms that are different or more severe than your usual migraine headache symptoms. This information is not intended to replace advice given to you by your health care provider. Make sure you discuss any questions you have with your health care provider. Document Revised: 02/13/2019 Document Reviewed: 12/04/2018 Elsevier Patient Education  [place].

## 2020-06-17 ENCOUNTER — Telehealth: Payer: Self-pay | Admitting: Nurse Practitioner

## 2020-06-17 ENCOUNTER — Encounter: Payer: Self-pay | Admitting: Nurse Practitioner

## 2020-06-17 MED ORDER — ONDANSETRON 4 MG PO TBDP
4.0000 mg | ORAL_TABLET | Freq: Three times a day (TID) | ORAL | 1 refills | Status: DC | PRN
Start: 2020-06-17 — End: 2023-01-08

## 2020-06-17 NOTE — Telephone Encounter (Signed)
Patient agrees to MRI and she has not gotten her labs done yet; she has an appt wed morning to have those done. She does not want to come here she would like to wait on her appt with [facility]. Advised her to keep her HA diary and will keep scheduled f/u with you on 07/05/20.

## 2020-06-17 NOTE — Telephone Encounter (Addendum)
Please call her:   1. I would like to order a Brain MRI W and WO contrast since these migraines started up suddenly. Is she agreeable?   2. Did she get her labs drawn, yet? If not, please do so and we can draw them in this clinic (will need to re -order) and still run them through [facility].   3. I will order Zofran- may make sleepy. May take the Advil as directed as that works for her.  4.  Please keep a HA diary. Look for any triggers, food, wine, sleep stress,    5. Further medication recommendation pending  Lab and imagine results. Keep OV 8/31 as arranged.

## 2020-06-18 ENCOUNTER — Other Ambulatory Visit: Payer: Self-pay | Admitting: Nurse Practitioner

## 2020-06-18 DIAGNOSIS — G43009 Migraine without aura, not intractable, without status migrainosus: Secondary | ICD-10-CM

## 2020-06-18 NOTE — Progress Notes (Signed)
I placed order for MRI brain.

## 2020-06-20 NOTE — Telephone Encounter (Signed)
[facility] needs diagnosis code for pt faxed over form

## 2020-06-21 NOTE — Telephone Encounter (Signed)
Do you know what form they are needing?

## 2020-06-23 LAB — LIPID PANEL
Chol/HDL Ratio: 2.6 ratio (ref 0.0–4.4)
Cholesterol, Total: 229 mg/dL — ABNORMAL HIGH (ref 100–199)
HDL: 89 mg/dL (ref >39)
LDL Chol Calc (NIH): 132 mg/dL — ABNORMAL HIGH (ref 0–99)
Triglycerides: 46 mg/dL (ref 0–149)
VLDL Cholesterol Cal: 8 mg/dL (ref 5–40)

## 2020-06-23 LAB — COMPREHENSIVE METABOLIC PANEL
ALT: 17 IU/L (ref 0–32)
AST: 24 IU/L (ref 0–40)
Albumin/Globulin Ratio: 1.7 (ref 1.2–2.2)
Albumin: 4.4 g/dL (ref 3.8–4.8)
Alkaline Phosphatase: 67 IU/L (ref 48–121)
BUN/Creatinine Ratio: 15 (ref 12–28)
BUN: 15 mg/dL (ref 8–27)
Bilirubin Total: 0.4 mg/dL (ref 0.0–1.2)
CO2: 26 mmol/L (ref 20–29)
Calcium: 9.8 mg/dL (ref 8.7–10.3)
Chloride: 101 mmol/L (ref 96–106)
Creatinine, Ser: 1.01 mg/dL — ABNORMAL HIGH (ref 0.57–1.00)
GFR calc Af Amer: 68 mL/min/{1.73_m2} (ref >59)
GFR calc non Af Amer: 59 mL/min/{1.73_m2} — ABNORMAL LOW (ref >59)
Globulin, Total: 2.6 g/dL (ref 1.5–4.5)
Glucose: 97 mg/dL (ref 65–99)
Potassium: 4.6 mmol/L (ref 3.5–5.2)
Sodium: 141 mmol/L (ref 134–144)
Total Protein: 7 g/dL (ref 6.0–8.5)

## 2020-06-23 LAB — CBC WITH DIFFERENTIAL/PLATELET
Basophils Absolute: 0.1 10*3/uL (ref 0.0–0.2)
Basos: 1 %
EOS (ABSOLUTE): 0 10*3/uL (ref 0.0–0.4)
Eos: 0 %
Hematocrit: 45.6 % (ref 34.0–46.6)
Hemoglobin: 15.6 g/dL (ref 11.1–15.9)
Immature Grans (Abs): 0 10*3/uL (ref 0.0–0.1)
Immature Granulocytes: 0 %
Lymphocytes Absolute: 2.2 10*3/uL (ref 0.7–3.1)
Lymphs: 40 %
MCH: 27.7 pg (ref 26.6–33.0)
MCHC: 34.2 g/dL (ref 31.5–35.7)
MCV: 81 fL (ref 79–97)
Monocytes Absolute: 0.4 10*3/uL (ref 0.1–0.9)
Monocytes: 8 %
Neutrophils Absolute: 2.8 10*3/uL (ref 1.4–7.0)
Neutrophils: 51 %
Platelets: 248 10*3/uL (ref 150–450)
RBC: 5.63 x10E6/uL — ABNORMAL HIGH (ref 3.77–5.28)
RDW: 14.4 % (ref 11.7–15.4)
WBC: 5.6 10*3/uL (ref 3.4–10.8)

## 2020-06-23 LAB — TSH: TSH: 0.957 u[IU]/mL (ref 0.450–4.500)

## 2020-06-23 LAB — HIV ANTIBODY (ROUTINE TESTING W REFLEX): HIV Screen 4th Generation wRfx: NONREACTIVE

## 2020-06-23 LAB — VITAMIN D 25 HYDROXY (VIT D DEFICIENCY, FRACTURES): Vit D, 25-Hydroxy: 22.3 ng/mL — ABNORMAL LOW (ref 30.0–100.0)

## 2020-06-23 LAB — B12 AND FOLATE PANEL
Folate: >20 ng/mL (ref >3.0)
Vitamin B-12: 633 pg/mL (ref 232–1245)

## 2020-06-23 LAB — HEMOGLOBIN A1C
Est. average glucose Bld gHb Est-mCnc: 117 mg/dL
Hgb A1c MFr Bld: 5.7 % — ABNORMAL HIGH (ref 4.8–5.6)

## 2020-06-23 LAB — HEPATITIS C ANTIBODY: Hep C Virus Ab: <0.1 s/co ratio (ref 0.0–0.9)

## 2020-06-27 ENCOUNTER — Telehealth (INDEPENDENT_AMBULATORY_CARE_PROVIDER_SITE_OTHER): Payer: Self-pay | Admitting: Gastroenterology

## 2020-06-27 ENCOUNTER — Other Ambulatory Visit: Payer: Self-pay

## 2020-06-27 DIAGNOSIS — Z1211 Encounter for screening for malignant neoplasm of colon: Secondary | ICD-10-CM

## 2020-06-27 NOTE — Progress Notes (Signed)
Gastroenterology Pre-Procedure Review  Request Date: 09/02/20 Requesting Physician: Dr. Vicente Males  PATIENT REVIEW QUESTIONS: The patient responded to the following health history questions as indicated:    1. Are you having any GI issues? no 2. Do you have a personal history of Polyps? no 3. Do you have a family history of Colon Cancer or Polyps? no 4. Diabetes Mellitus? no 5. Joint replacements in the past 12 months?no 6. Major health problems in the past 3 months?no 7. Any artificial heart valves, MVP, or defibrillator?no    MEDICATIONS & ALLERGIES:    Patient reports the following regarding taking any anticoagulation/antiplatelet therapy:   Plavix, Coumadin, Eliquis, Xarelto, Lovenox, Pradaxa, Brilinta, or Effient? no Aspirin? no  Patient confirms/reports the following medications:  Current Outpatient Medications  Medication Sig Dispense Refill  . Ascorbic Acid (VITAMIN C) 1000 MG tablet Take 1,000 mg by mouth daily.    . Biotin 1 MG CAPS Take by mouth.    . Calcium-Vitamin D-Vitamin K (VIACTIV) [phone] MG-UNT-MCG CHEW Chew by mouth.    . Cholecalciferol (VITAMIN D) 50 MCG (2000 UT) CAPS Take by mouth.    . Multiple Vitamin (MULTIVITAMIN) tablet Take 1 tablet by mouth daily.    . ondansetron (ZOFRAN ODT) 4 MG disintegrating tablet Take 1 tablet (4 mg total) by mouth every 8 (eight) hours as needed for nausea or vomiting. 30 tablet 1  . Probiotic Product (TRUBIOTICS PO) Take by mouth.     No current facility-administered medications for this visit.    Patient confirms/reports the following allergies:  Allergies  Allergen Reactions  . Aspirin     No orders of the defined types were placed in this encounter.   AUTHORIZATION INFORMATION Primary Insurance: 1D#: Group #:  Secondary Insurance: 1D#: Group #:  SCHEDULE INFORMATION: Date: 10/29/21Time: Location:[facility]

## 2020-06-28 ENCOUNTER — Telehealth: Payer: Self-pay | Admitting: Nurse Practitioner

## 2020-06-28 NOTE — Telephone Encounter (Signed)
[facility] needs updated order resent with diagnosis.

## 2020-06-29 NOTE — Telephone Encounter (Signed)
Yes I do. I faxed it initially on 06/20/2020. I re faxed on 06/29/2020.   Thank you!

## 2020-07-01 ENCOUNTER — Other Ambulatory Visit: Payer: Self-pay

## 2020-07-05 ENCOUNTER — Ambulatory Visit (INDEPENDENT_AMBULATORY_CARE_PROVIDER_SITE_OTHER): Payer: 59 | Admitting: Nurse Practitioner

## 2020-07-05 ENCOUNTER — Other Ambulatory Visit: Payer: Self-pay

## 2020-07-05 ENCOUNTER — Encounter: Payer: Self-pay | Admitting: Nurse Practitioner

## 2020-07-05 VITALS — BP 120/84 | HR 95 | Temp 97.7°F | Ht 62.0 in | Wt 164.0 lb

## 2020-07-05 DIAGNOSIS — E785 Hyperlipidemia, unspecified: Secondary | ICD-10-CM | POA: Diagnosis not present

## 2020-07-05 DIAGNOSIS — Z23 Encounter for immunization: Secondary | ICD-10-CM

## 2020-07-05 DIAGNOSIS — E559 Vitamin D deficiency, unspecified: Secondary | ICD-10-CM | POA: Insufficient documentation

## 2020-07-05 DIAGNOSIS — G43009 Migraine without aura, not intractable, without status migrainosus: Secondary | ICD-10-CM | POA: Diagnosis not present

## 2020-07-05 DIAGNOSIS — Z0000 Encounter for general adult medical examination without abnormal findings: Secondary | ICD-10-CM | POA: Diagnosis not present

## 2020-07-05 NOTE — Assessment & Plan Note (Signed)
Taking Vit D 3 1000 IU- recheck in 6 mos. Add calcium 1200 mg daily- better if in diet.

## 2020-07-05 NOTE — Assessment & Plan Note (Signed)
No recent HA . Monitoring.

## 2020-07-05 NOTE — Patient Instructions (Addendum)
Continue with current treatment plan with vitamin D 800 IUs to 1000 IUs daily. We can check the level in 6 mos. Calcium 1200 mg daily- best to get through diet.   Continue to work with diet and exercise as you are doing.  Follow-up weight, blood sugar, and cholesterol labs in 6 months.  See your GYN as planned in December for mammogram and Pap test as needed.  Proceed with the colonoscopy as planned in October.  Consider the flu shot, shingles vaccine, and we will give you a tetanus Tdap vaccine today.  We discussed healthy diet options, and if you would like more formal treatment we can refer you to the [facility] weight loss management team.  Hx of migraines no longer bothersome- you may cancel  the head MRI and call back if symptoms return       Preventive Care 15-33 Years Old, Female Preventive care refers to visits with your health care provider and lifestyle choices that can promote health and wellness. This includes:  A yearly physical exam. This may also be called an annual well check.  Regular dental visits and eye exams.  Immunizations.  Screening for certain conditions.  Healthy lifestyle choices, such as eating a healthy diet, getting regular exercise, not using drugs or products that contain nicotine and tobacco, and limiting alcohol use. What can I expect for my preventive care visit? Physical exam Your health care provider will check your:  Height and weight. This may be used to calculate body mass index (BMI), which tells if you are at a healthy weight.  Heart rate and blood pressure.  Skin for abnormal spots. Counseling Your health care provider may ask you questions about your:  Alcohol, tobacco, and drug use.  Emotional well-being.  Home and relationship well-being.  Sexual activity.  Eating habits.  Work and work Statistician.  Method of birth control.  Menstrual cycle.  Pregnancy history. What immunizations do I need?  Influenza (flu)  vaccine  This is recommended every year. Tetanus, diphtheria, and pertussis (Tdap) vaccine  You may need a Td booster every 10 years. Varicella (chickenpox) vaccine  You may need this if you have not been vaccinated. Zoster (shingles) vaccine  You may need this after age 81. Measles, mumps, and rubella (MMR) vaccine  You may need at least one dose of MMR if you were born in 1957 or later. You may also need a second dose. Pneumococcal conjugate (PCV13) vaccine  You may need this if you have certain conditions and were not previously vaccinated. Pneumococcal polysaccharide (PPSV23) vaccine  You may need one or two doses if you smoke cigarettes or if you have certain conditions. Meningococcal conjugate (MenACWY) vaccine  You may need this if you have certain conditions. Hepatitis A vaccine  You may need this if you have certain conditions or if you travel or work in places where you may be exposed to hepatitis A. Hepatitis B vaccine  You may need this if you have certain conditions or if you travel or work in places where you may be exposed to hepatitis B. Haemophilus influenzae type b (Hib) vaccine  You may need this if you have certain conditions. Human papillomavirus (HPV) vaccine  If recommended by your health care provider, you may need three doses over 6 months. You may receive vaccines as individual doses or as more than one vaccine together in one shot (combination vaccines). Talk with your health care provider about the risks and benefits of combination vaccines. What tests  do I need? Blood tests  Lipid and cholesterol levels. These may be checked every 5 years, or more frequently if you are over 12 years old.  Hepatitis C test.  Hepatitis B test. Screening  Lung cancer screening. You may have this screening every year starting at age 71 if you have a 30-pack-year history of smoking and currently smoke or have quit within the past 15 years.  Colorectal cancer  screening. All adults should have this screening starting at age 8 and continuing until age 63. Your health care provider may recommend screening at age 26 if you are at increased risk. You will have tests every 1-10 years, depending on your results and the type of screening test.  Diabetes screening. This is done by checking your blood sugar (glucose) after you have not eaten for a while (fasting). You may have this done every 1-3 years.  Mammogram. This may be done every 1-2 years. Talk with your health care provider about when you should start having regular mammograms. This may depend on whether you have a family history of breast cancer.  BRCA-related cancer screening. This may be done if you have a family history of breast, ovarian, tubal, or peritoneal cancers.  Pelvic exam and Pap test. This may be done every 3 years starting at age 7. Starting at age 28, this may be done every 5 years if you have a Pap test in combination with an HPV test. Other tests  Sexually transmitted disease (STD) testing.  Bone density scan. This is done to screen for osteoporosis. You may have this scan if you are at high risk for osteoporosis. Follow these instructions at home: Eating and drinking  Eat a diet that includes fresh fruits and vegetables, whole grains, lean protein, and low-fat dairy.  Take vitamin and mineral supplements as recommended by your health care provider.  Do not drink alcohol if: ? Your health care provider tells you not to drink. ? You are pregnant, may be pregnant, or are planning to become pregnant.  If you drink alcohol: ? Limit how much you have to 0-1 drink a day. ? Be aware of how much alcohol is in your drink. In the [place] one drink equals one 12 oz bottle of beer (355 mL), one 5 oz glass of wine (148 mL), or one 1 oz glass of hard liquor (44 mL). Lifestyle  Take daily care of your teeth and gums.  Stay active. Exercise for at least 30 minutes on 5 or more days  each week.  Do not use any products that contain nicotine or tobacco, such as cigarettes, e-cigarettes, and chewing tobacco. If you need help quitting, ask your health care provider.  If you are sexually active, practice safe sex. Use a condom or other form of birth control (contraception) in order to prevent pregnancy and STIs (sexually transmitted infections).  If told by your health care provider, take low-dose aspirin daily starting at age 72. What's next?  Visit your health care provider once a year for a well check visit.  Ask your health care provider how often you should have your eyes and teeth checked.  Stay up to date on all vaccines. This information is not intended to replace advice given to you by your health care provider. Make sure you discuss any questions you have with your health care provider. Document Revised: 07/03/2018 Document Reviewed: 07/03/2018 Elsevier Patient Education  [employer].  Vitamin D Deficiency Vitamin D deficiency is when your body  does not have enough vitamin D. Vitamin D is important to your body for many reasons:  It helps the body absorb two important minerals--calcium and phosphorus.  It plays a role in bone health.  It may help to prevent some diseases, such as diabetes and multiple sclerosis.  It plays a role in muscle function, including heart function. If vitamin D deficiency is severe, it can cause a condition in which your bones become soft. In adults, this condition is called osteomalacia. In children, this condition is called rickets. What are the causes? This condition may be caused by:  Not eating enough foods that contain vitamin D.  Not getting enough natural sun exposure.  Having certain digestive system diseases that make it difficult for your body to absorb vitamin D. These diseases include Crohn's disease, chronic pancreatitis, and cystic fibrosis.  Having a surgery in which a part of the stomach or a part of  the small intestine is removed.  Having chronic kidney disease or liver disease. What increases the risk? You are more likely to develop this condition if you:  Are older.  Do not spend much time outdoors.  Live in a long-term care facility.  Have had broken bones.  Have weak or thin bones (osteoporosis).  Have a disease or condition that changes how the body absorbs vitamin D.  Have dark skin.  Take certain medicines, such as steroid medicines or certain seizure medicines.  Are overweight or obese. What are the signs or symptoms? In mild cases of vitamin D deficiency, there may not be any symptoms. If the condition is severe, symptoms may include:  Bone pain.  Muscle pain.  Falling often.  Broken bones caused by a minor injury. How is this diagnosed? This condition may be diagnosed with blood tests. Imaging tests such as X-rays may also be done to look for changes in the bone. How is this treated? Treatment for this condition may depend on what caused the condition. Treatment options include:  Taking vitamin D supplements. Your health care provider will suggest what dose is best for you.  Taking a calcium supplement. Your health care provider will suggest what dose is best for you. Follow these instructions at home: Eating and drinking   Eat foods that contain vitamin D. Choices include: ? Fortified dairy products, cereals, or juices. Fortified means that vitamin D has been added to the food. Check the label on the package to see if the food is fortified. ? Fatty fish, such as salmon or trout. ? Eggs. ? Oysters. ? Mushrooms. The items listed above may not be a complete list of recommended foods and beverages. Contact a dietitian for more information. General instructions  Take medicines and supplements only as told by your health care provider.  Get regular, safe exposure to natural sunlight.  Do not use a tanning bed.  Maintain a healthy weight. Lose  weight if needed.  Keep all follow-up visits as told by your health care provider. This is important. How is this prevented? You can get vitamin D by:  Eating foods that naturally contain vitamin D.  Eating or drinking products that have been fortified with vitamin D, such as cereals, juices, and dairy products (including milk).  Taking a vitamin D supplement or a multivitamin supplement that contains vitamin D.  Being in the sun. Your body naturally makes vitamin D when your skin is exposed to sunlight. Your body changes the sunlight into a form of the vitamin that it can use.  Contact a health care provider if:  Your symptoms do not go away.  You feel nauseous or you vomit.  You have fewer bowel movements than usual or are constipated. Summary  Vitamin D deficiency is when your body does not have enough vitamin D.  Vitamin D is important to your body for good bone health and muscle function, and it may help prevent some diseases.  Vitamin D deficiency is primarily treated through supplementation. Your health care provider will suggest what dose is best for you.  You can get vitamin D by eating foods that contain vitamin D, by being in the sun, and by taking a vitamin D supplement or a multivitamin supplement that contains vitamin D. This information is not intended to replace advice given to you by your health care provider. Make sure you discuss any questions you have with your health care provider. Document Revised: 06/30/2018 Document Reviewed: 06/30/2018 Elsevier Patient Education  2020 [employer].  Preventing High Cholesterol Cholesterol is a white, waxy substance similar to fat that the human body needs to help build cells. The liver makes all the cholesterol that a person's body needs. Having high cholesterol (hypercholesterolemia) increases a person's risk for heart disease and stroke. Extra (excess) cholesterol comes from the food the person eats. High cholesterol can  often be prevented with diet and lifestyle changes. If you already have high cholesterol, you can control it with diet and lifestyle changes and with medicine. How can high cholesterol affect me? If you have high cholesterol, deposits (plaques) may build up on the walls of your arteries. The arteries are the blood vessels that carry blood away from your heart. Plaques make the arteries narrower and stiffer. This can limit or block blood flow and cause blood clots to form. Blood clots:  Are tiny balls of cells that form in your blood.  Can move to the heart or brain, causing a heart attack or stroke. Plaques in arteries greatly increase your risk for heart attack and stroke.Making diet and lifestyle changes can reduce your risk for these conditions that may threaten your life. What can increase my risk? This condition is more likely to develop in people who:  Eat foods that are high in saturated fat or cholesterol. Saturated fat is mostly found in: ? Foods that contain animal fat, such as red meat and some dairy products. ? Certain fatty foods made from plants, such as tropical oils.  Are overweight.  Are not getting enough exercise.  Have a family history of high cholesterol. What actions can I take to prevent this? Nutrition   Eat less saturated fat.  Avoid trans fats (partially hydrogenated oils). These are often found in margarine and in some baked goods, fried foods, and snacks bought in packages.  Avoid precooked or cured meat, such as sausages or meat loaves.  Avoid foods and drinks that have added sugars.  Eat more fruits, vegetables, and whole grains.  Choose healthy sources of protein, such as fish, poultry, lean cuts of red meat, beans, peas, lentils, and nuts.  Choose healthy sources of fat, such as: ? Nuts. ? Vegetable oils, especially olive oil. ? Fish that have healthy fats (omega-3 fatty acids), such as mackerel or salmon. The items listed above may not be a  complete list of recommended foods and beverages. Contact a dietitian for more information. Lifestyle  Lose weight if you are overweight. Losing 5-10 lb (2.3-4.5 kg) can help prevent or control high cholesterol. It can also lower your  risk for diabetes and high blood pressure. Ask your health care provider to help you with a diet and exercise plan to lose weight safely.  Do not use any products that contain nicotine or tobacco, such as cigarettes, e-cigarettes, and chewing tobacco. If you need help quitting, ask your health care provider.  Limit your alcohol intake. ? Do not drink alcohol if:  Your health care provider tells you not to drink.  You are pregnant, may be pregnant, or are planning to become pregnant. ? If you drink alcohol:  Limit how much you use to:  0-1 drink a day for women.  0-2 drinks a day for men.  Be aware of how much alcohol is in your drink. In the [place] one drink equals one 12 oz bottle of beer (355 mL), one 5 oz glass of wine (148 mL), or one 1 oz glass of hard liquor (44 mL). Activity   Get enough exercise. Each week, do at least 150 minutes of exercise that takes a medium level of effort (moderate-intensity exercise). ? This is exercise that:  Makes your heart beat faster and makes you breathe harder than usual.  Allows you to still be able to talk. ? You could exercise in short sessions several times a day or longer sessions a few times a week. For example, on 5 days each week, you could walk fast or ride your bike 3 times a day for 10 minutes each time.  Do exercises as told by your health care provider. Medicines  In addition to diet and lifestyle changes, your health care provider may recommend medicines to help lower cholesterol. This may be a medicine to lower the amount of cholesterol your liver makes. You may need medicine if: ? Diet and lifestyle changes do not lower your cholesterol enough. ? You have high cholesterol and other risk factors  for heart disease or stroke.  Take over-the-counter and prescription medicines only as told by your health care provider. General information  Manage your risk factors for high cholesterol. Talk with your health care provider about all your risk factors and how to lower your risk.  Manage other conditions that you have, such as diabetes or high blood pressure (hypertension).  Have blood tests to check your cholesterol levels at regular points in time as told by your health care provider.  Keep all follow-up visits as told by your health care provider. This is important. Where to find more information  American Heart Association: www.heart.org  National Heart, Lung, and Blood Institute: https://wilson-eaton.com/ Summary  High cholesterol increases your risk for heart disease and stroke. By keeping your cholesterol level low, you can reduce your risk for these conditions.  High cholesterol can often be prevented with diet and lifestyle changes.  Work with your health care provider to manage your risk factors, and have your blood tested regularly. This information is not intended to replace advice given to you by your health care provider. Make sure you discuss any questions you have with your health care provider. Document Revised: 02/13/2019 Document Reviewed: 06/30/2016 Elsevier Patient Education  2020 [employer].

## 2020-07-05 NOTE — Assessment & Plan Note (Signed)
Going to the gym TIW- increasing to 5 x weekly. Diet information and education today. Motivated to lose wt. recheck in 6 months.

## 2020-07-05 NOTE — Progress Notes (Signed)
Established Patient Office Visit  Subjective:  Patient ID: Kaitlyn Forbes, female    DOB: 1956-05-17  Age: 64 y.o. MRN: [id number]  CC:  Chief Complaint  Patient presents with  . Annual Exam    CPE    HPI Kaitlyn Forbes is a 64 year old who presents for complete physical exam.  Recent laboratory studies showed that she has prediabetes, elevated BMI of 30 hyperlipidemia, and mildly low vitamin D on supplement.  She was reporting migraine headaches that have resolved.  She wants to cancel her MRI.    Family history is negative for anemia.  Positive for hypertension.     Patient presents today for complete physical.  Immunizations: Agrees to Tdap today, decline flu, shingles- Diet:healthy- working on wt and wants to get A1c down Exercise: goes to the gym - tiw to increased to 5 per wk Colonoscopy: scheduled  Pap Smear: GYN Mammogram: Dentist: UTD Vision: UTD Seatbelt: always Sun screen   Past Medical History:  Diagnosis Date  . Diverticulosis   . Fibroids   . Migraines   . Osteoarthritis    right knee    Past Surgical History:  Procedure Laterality Date  . ABDOMINAL HYSTERECTOMY  02/1997   TAH for fibroids/ Dr Rayford Halsted  . COLONOSCOPY  09/01/2010   diverticulosis  . ECTOPIC PREGNANCY SURGERY Left 11/1990   with left salpingectomy  . ESOPHAGOGASTRODUODENOSCOPY  09/01/2010   Dr Donnella Sham. Prior EGD for anemia work up by Dr Nicolasa Ducking.  Marland Kitchen HYSTEROSALPINGOGRAM  01/1991   distal obstruction of right Fallopian tube  . INCISION AND DRAINAGE     left bartholin cyst/ Word catheter    Family History  Problem Relation Age of Onset  . Hypertension Mother   . Arthritis Mother   . Diabetes Mother   . Diabetes Father   . COPD Father   . Breast cancer Neg Hx     Social History   Socioeconomic History  . Marital status: Married    Spouse name: Rosanna Randy  . Number of children: 1  . Years of education: Not on file  . Highest education level: Bachelor's degree (e.g., BA,  AB, BS)  Occupational History  . Occupation: Fish farm manager  Tobacco Use  . Smoking status: Never Smoker  . Smokeless tobacco: Never Used  Vaping Use  . Vaping Use: Never used  Substance and Sexual Activity  . Alcohol use: Yes    Comment: occasional wine  . Drug use: No  . Sexual activity: Yes    Partners: Male    Birth control/protection: Surgical  Other Topics Concern  . Not on file  Social History Narrative   Patient's daughter currently living in [place], [place]   Social Determinants of Health   Financial Resource Strain:   . Difficulty of Paying Living Expenses: Not on file  Food Insecurity:   . Worried About Charity fundraiser in the Last Year: Not on file  . Ran Out of Food in the Last Year: Not on file  Transportation Needs:   . Lack of Transportation (Medical): Not on file  . Lack of Transportation (Non-Medical): Not on file  Physical Activity:   . Days of Exercise per Week: Not on file  . Minutes of Exercise per Session: Not on file  Stress:   . Feeling of Stress : Not on file  Social Connections:   . Frequency of Communication with Friends and Family: Not on file  . Frequency of Social Gatherings with Friends and Family: Not  on file  . Attends Religious Services: Not on file  . Active Member of Clubs or Organizations: Not on file  . Attends Archivist Meetings: Not on file  . Marital Status: Not on file  Intimate Partner Violence:   . Fear of Current or Ex-Partner: Not on file  . Emotionally Abused: Not on file  . Physically Abused: Not on file  . Sexually Abused: Not on file    Outpatient Medications Prior to Visit  Medication Sig Dispense Refill  . Ascorbic Acid (VITAMIN C) 1000 MG tablet Take 1,000 mg by mouth daily.    . Biotin 1 MG CAPS Take by mouth.    . Calcium-Vitamin D-Vitamin K (VIACTIV) [phone] MG-UNT-MCG CHEW Chew by mouth.    . Cholecalciferol (VITAMIN D) 50 MCG (2000 UT) CAPS Take by mouth.    . Multiple Vitamin  (MULTIVITAMIN) tablet Take 1 tablet by mouth daily.    . ondansetron (ZOFRAN ODT) 4 MG disintegrating tablet Take 1 tablet (4 mg total) by mouth every 8 (eight) hours as needed for nausea or vomiting. 30 tablet 1  . Probiotic Product (TRUBIOTICS PO) Take by mouth.     No facility-administered medications prior to visit.    Allergies  Allergen Reactions  . Aspirin     Review of Systems  Constitutional: Negative.   HENT: Negative.   Eyes: Negative.   Respiratory: Negative.   Cardiovascular: Negative.   Gastrointestinal: Negative.   Endocrine: Negative.   Genitourinary: Negative.   Musculoskeletal: Negative.   Allergic/Immunologic: Negative.   Neurological: Negative.  Negative for headaches.  Hematological: Negative.   Psychiatric/Behavioral: Negative.       Objective:    Physical Exam Vitals reviewed.  Constitutional:      Appearance: Normal appearance.  Eyes:     Conjunctiva/sclera: Conjunctivae normal.     Pupils: Pupils are equal, round, and reactive to light.  Cardiovascular:     Rate and Rhythm: Normal rate and regular rhythm.     Pulses: Normal pulses.     Heart sounds: Normal heart sounds.  Pulmonary:     Effort: Pulmonary effort is normal.     Breath sounds: Normal breath sounds.  Abdominal:     Palpations: Abdomen is soft.     Tenderness: There is no abdominal tenderness.  Musculoskeletal:        General: Normal range of motion.     Cervical back: Normal range of motion and neck supple.  Skin:    General: Skin is warm and dry.  Neurological:     General: No focal deficit present.     Mental Status: She is alert and oriented to person, place, and time.  Psychiatric:        Mood and Affect: Mood normal.        Behavior: Behavior normal.        Thought Content: Thought content normal.        Judgment: Judgment normal.     BP 120/84 (BP Location: Left Arm, Patient Position: Sitting, Cuff Size: Normal)   Pulse 95   Temp 97.7 F (36.5 C) (Oral)    Ht 5\' 2"  (1.575 m)   Wt 164 lb (74.4 kg)   SpO2 96%   BMI 30.00 kg/m  Wt Readings from Last 3 Encounters:  07/05/20 164 lb (74.4 kg)  06/15/20 165 lb (74.8 kg)  11/03/19 163 lb (73.9 kg)   Pulse Readings from Last 3 Encounters:  07/05/20 95  06/15/20 64  10/27/18 81  BP Readings from Last 3 Encounters:  07/05/20 120/84  06/15/20 120/78  11/03/19 130/80    Lab Results  Component Value Date   CHOL 229 (H) 06/22/2020   HDL 89 06/22/2020   LDLCALC 132 (H) 06/22/2020   TRIG 46 06/22/2020   CHOLHDL 2.6 06/22/2020      There are no preventive care reminders to display for this patient.  There are no preventive care reminders to display for this patient.  Lab Results  Component Value Date   TSH 0.957 06/22/2020   Lab Results  Component Value Date   WBC 5.6 06/22/2020   HGB 15.6 06/22/2020   HCT 45.6 06/22/2020   MCV 81 06/22/2020   PLT 248 06/22/2020   Lab Results  Component Value Date   NA 141 06/22/2020   K 4.6 06/22/2020   CO2 26 06/22/2020   GLUCOSE 97 06/22/2020   BUN 15 06/22/2020   CREATININE 1.01 (H) 06/22/2020   BILITOT 0.4 06/22/2020   ALKPHOS 67 06/22/2020   AST 24 06/22/2020   ALT 17 06/22/2020   PROT 7.0 06/22/2020   ALBUMIN 4.4 06/22/2020   CALCIUM 9.8 06/22/2020   Lab Results  Component Value Date   CHOL 229 (H) 06/22/2020   Lab Results  Component Value Date   HDL 89 06/22/2020   Lab Results  Component Value Date   LDLCALC 132 (H) 06/22/2020   Lab Results  Component Value Date   TRIG 46 06/22/2020   Lab Results  Component Value Date   CHOLHDL 2.6 06/22/2020   Lab Results  Component Value Date   HGBA1C 5.7 (H) 06/22/2020      Assessment & Plan:   Problem List Items Addressed This Visit      Cardiovascular and Mediastinum   Migraine without aura and without status migrainosus, not intractable    No recent HA . Monitoring.         Other   Encounter for preventive health examination - Primary   Vitamin D  deficiency    Taking Vit D 3 1000 IU- recheck in 6 mos. Add calcium 1200 mg daily- better if in diet.       Hyperlipidemia    Going to the gym TIW- increasing to 5 x weekly. Diet information and education today. Motivated to lose wt. recheck in 6 months.      Need for Tdap vaccination   Relevant Orders   Tdap vaccine greater than or equal to 7yo IM (Completed)      No orders of the defined types were placed in this encounter.   Follow-up: Return in about 6 months (around 01/02/2021).   This visit occurred during the SARS-CoV-2 public health emergency.  Safety protocols were in place, including screening questions prior to the visit, additional usage of staff PPE, and extensive cleaning of exam room while observing appropriate contact time as indicated for disinfecting solutions.   Denice Paradise, NP

## 2020-07-12 ENCOUNTER — Telehealth: Payer: Self-pay | Admitting: Nurse Practitioner

## 2020-07-12 NOTE — Telephone Encounter (Signed)
Done it was re faxed Thank you!

## 2020-07-12 NOTE — Telephone Encounter (Signed)
[facility] called need order for MRI faxed over to [phone]

## 2020-08-31 ENCOUNTER — Other Ambulatory Visit
Admission: RE | Admit: 2020-08-31 | Discharge: 2020-08-31 | Disposition: A | Discharge status: Home / Self Care | Payer: 59 | Source: Ambulatory Visit | Attending: Gastroenterology | Admitting: Gastroenterology

## 2020-08-31 ENCOUNTER — Other Ambulatory Visit: Payer: Self-pay | Admitting: Obstetrics

## 2020-08-31 ENCOUNTER — Other Ambulatory Visit: Payer: Self-pay

## 2020-08-31 DIAGNOSIS — Z01818 Encounter for other preprocedural examination: Secondary | ICD-10-CM | POA: Diagnosis not present

## 2020-08-31 DIAGNOSIS — Z20822 Contact with and (suspected) exposure to covid-19: Secondary | ICD-10-CM | POA: Diagnosis not present

## 2020-08-31 LAB — SARS CORONAVIRUS 2 (TAT 6-24 HRS): SARS Coronavirus 2: NEGATIVE

## 2020-09-01 ENCOUNTER — Encounter: Payer: Self-pay | Admitting: Gastroenterology

## 2020-09-02 ENCOUNTER — Other Ambulatory Visit: Payer: Self-pay

## 2020-09-02 ENCOUNTER — Encounter
Admission: RE | Disposition: A | Discharge status: Home / Self Care | Payer: Self-pay | Source: Home / Self Care | Attending: Gastroenterology

## 2020-09-02 ENCOUNTER — Encounter: Payer: Self-pay | Admitting: Gastroenterology

## 2020-09-02 ENCOUNTER — Ambulatory Visit
Admission: RE | Admit: 2020-09-02 | Discharge: 2020-09-02 | Disposition: A | Discharge status: Home / Self Care | Payer: 59 | Attending: Gastroenterology | Admitting: Gastroenterology

## 2020-09-02 ENCOUNTER — Ambulatory Visit: Payer: 59 | Admitting: Anesthesiology

## 2020-09-02 DIAGNOSIS — Z1211 Encounter for screening for malignant neoplasm of colon: Secondary | ICD-10-CM

## 2020-09-02 DIAGNOSIS — Z833 Family history of diabetes mellitus: Secondary | ICD-10-CM | POA: Diagnosis not present

## 2020-09-02 DIAGNOSIS — Z825 Family history of asthma and other chronic lower respiratory diseases: Secondary | ICD-10-CM | POA: Insufficient documentation

## 2020-09-02 DIAGNOSIS — Z79899 Other long term (current) drug therapy: Secondary | ICD-10-CM | POA: Diagnosis not present

## 2020-09-02 DIAGNOSIS — Z90710 Acquired absence of both cervix and uterus: Secondary | ICD-10-CM | POA: Insufficient documentation

## 2020-09-02 DIAGNOSIS — Z8249 Family history of ischemic heart disease and other diseases of the circulatory system: Secondary | ICD-10-CM | POA: Diagnosis not present

## 2020-09-02 DIAGNOSIS — Z7982 Long term (current) use of aspirin: Secondary | ICD-10-CM | POA: Insufficient documentation

## 2020-09-02 DIAGNOSIS — Z886 Allergy status to analgesic agent status: Secondary | ICD-10-CM | POA: Diagnosis not present

## 2020-09-02 DIAGNOSIS — Z8261 Family history of arthritis: Secondary | ICD-10-CM | POA: Insufficient documentation

## 2020-09-02 HISTORY — PX: COLONOSCOPY WITH PROPOFOL: SHX5780

## 2020-09-02 SURGERY — COLONOSCOPY WITH PROPOFOL
Anesthesia: General

## 2020-09-02 MED ORDER — SODIUM CHLORIDE 0.9 % IV SOLN: INTRAVENOUS | Status: DC

## 2020-09-02 MED ORDER — PROPOFOL 500 MG/50ML IV EMUL
INTRAVENOUS | Status: DC | PRN
  Administered 2020-09-02: 140 ug/kg/min via INTRAVENOUS

## 2020-09-02 MED ORDER — PROPOFOL 10 MG/ML IV BOLUS
INTRAVENOUS | Status: DC | PRN
  Administered 2020-09-02: 70 mg via INTRAVENOUS

## 2020-09-02 NOTE — H&P (Signed)
Kaitlyn Bellows, MD [address], [place], [place], [place], [address] [address], [place], [place], [place], [address] Phone: [phone]  Fax: [phone]  Primary Care Physician:  Marval Regal, NP   Pre-Procedure History & Physical: HPI:  Kaitlyn Forbes is a 64 y.o. female is here for an colonoscopy.   Past Medical History:  Diagnosis Date  . Diverticulosis   . Fibroids   . Migraines   . Osteoarthritis    right knee    Past Surgical History:  Procedure Laterality Date  . ABDOMINAL HYSTERECTOMY  02/1997   TAH for fibroids/ Dr Rayford Halsted  . COLONOSCOPY  09/01/2010   diverticulosis  . ECTOPIC PREGNANCY SURGERY Left 11/1990   with left salpingectomy  . ESOPHAGOGASTRODUODENOSCOPY  09/01/2010   Dr Donnella Sham. Prior EGD for anemia work up by Dr Nicolasa Ducking.  Marland Kitchen HYSTEROSALPINGOGRAM  01/1991   distal obstruction of right Fallopian tube  . INCISION AND DRAINAGE     left bartholin cyst/ Word catheter    Prior to Admission medications   Medication Sig Start Date End Date Taking? Authorizing Provider  Ascorbic Acid (VITAMIN C) 1000 MG tablet Take 1,000 mg by mouth daily.   Yes [provider]  Biotin 1 MG CAPS Take by mouth.   Yes [provider]  Calcium-Vitamin D-Vitamin K (VIACTIV) [phone] MG-UNT-MCG CHEW Chew by mouth.    Yes [provider]  Cholecalciferol (VITAMIN D) 50 MCG (2000 UT) CAPS Take by mouth.   Yes [provider]  Multiple Vitamin (MULTIVITAMIN) tablet Take 1 tablet by mouth daily.   Yes [provider]  ondansetron (ZOFRAN ODT) 4 MG disintegrating tablet Take 1 tablet (4 mg total) by mouth every 8 (eight) hours as needed for nausea or vomiting. 06/17/20   Marval Regal, NP  Probiotic Product (TRUBIOTICS PO) Take by mouth.    [provider]    Allergies as of 06/27/2020 - Review Complete 06/27/2020  Allergen Reaction Noted  . Aspirin      Family History  Problem Relation Age of Onset  .  Hypertension Mother   . Arthritis Mother   . Diabetes Mother   . Diabetes Father   . COPD Father   . Breast cancer Neg Hx     Social History   Socioeconomic History  . Marital status: Married    Spouse name: Rosanna Randy  . Number of children: 1  . Years of education: Not on file  . Highest education level: Bachelor's degree (e.g., BA, AB, BS)  Occupational History  . Occupation: Fish farm manager  Tobacco Use  . Smoking status: Never Smoker  . Smokeless tobacco: Never Used  Vaping Use  . Vaping Use: Never used  Substance and Sexual Activity  . Alcohol use: Yes    Comment: occasional wine  . Drug use: No  . Sexual activity: Yes    Partners: Male    Birth control/protection: Surgical  Other Topics Concern  . Not on file  Social History Narrative   Patient's daughter currently living in [place], [place]   Social Determinants of Health   Financial Resource Strain:   . Difficulty of Paying Living Expenses: Not on file  Food Insecurity:   . Worried About Charity fundraiser in the Last Year: Not on file  . Ran Out of Food in the Last Year: Not on file  Transportation Needs:   . Lack of Transportation (Medical): Not on file  . Lack of Transportation (Non-Medical): Not  on file  Physical Activity:   . Days of Exercise per Week: Not on file  . Minutes of Exercise per Session: Not on file  Stress:   . Feeling of Stress : Not on file  Social Connections:   . Frequency of Communication with Friends and Family: Not on file  . Frequency of Social Gatherings with Friends and Family: Not on file  . Attends Religious Services: Not on file  . Active Member of Clubs or Organizations: Not on file  . Attends Archivist Meetings: Not on file  . Marital Status: Not on file  Intimate Partner Violence:   . Fear of Current or Ex-Partner: Not on file  . Emotionally Abused: Not on file  . Physically Abused: Not on file  . Sexually Abused: Not on file    Review of Systems: See  HPI, otherwise negative ROS  Physical Exam: BP 125/71   Pulse 84   Temp (!) 96.8 F (36 C) (Temporal)   Resp 16   Ht 5\' 2"  (1.575 m)   Wt 72.6 kg   SpO2 99%   BMI 29.26 kg/m  General:   Alert,  pleasant and cooperative in NAD Head:  Normocephalic and atraumatic. Neck:  Supple; no masses or thyromegaly. Lungs:  Clear throughout to auscultation, normal respiratory effort.    Heart:  +S1, +S2, Regular rate and rhythm, No edema. Abdomen:  Soft, nontender and nondistended. Normal bowel sounds, without guarding, and without rebound.   Neurologic:  Alert and  oriented x4;  grossly normal neurologically.  Impression/Plan: [name] is here for an colonoscopy to be performed for Screening colonoscopy average risk   Risks, benefits, limitations, and alternatives regarding  colonoscopy have been reviewed with the patient.  Questions have been answered.  All parties agreeable.   Kaitlyn Bellows, MD  09/02/2020, 10:29 AM

## 2020-09-02 NOTE — Anesthesia Postprocedure Evaluation (Signed)
Anesthesia Post Note  Patient: Kaitlyn Forbes  Procedure(s) Performed: COLONOSCOPY WITH PROPOFOL (N/A )  Patient location during evaluation: Endoscopy Anesthesia Type: General Level of consciousness: awake and alert and oriented Pain management: pain level controlled Vital Signs Assessment: post-procedure vital signs reviewed and stable Respiratory status: spontaneous breathing, nonlabored ventilation and respiratory function stable Cardiovascular status: blood pressure returned to baseline and stable Postop Assessment: no signs of nausea or vomiting Anesthetic complications: no   No complications documented.   Last Vitals:  Vitals:   09/02/20 1120 09/02/20 1130  BP: 116/74 126/72  Pulse: 63 (!) 59  Resp: 16 (!) 23  Temp:    SpO2: 97% 100%    Last Pain:  Vitals:   09/02/20 1100  TempSrc: Temporal  PainSc:                  [name]

## 2020-09-02 NOTE — Anesthesia Preprocedure Evaluation (Signed)
Anesthesia Evaluation  Patient identified by MRN, date of birth, ID band Patient awake    Reviewed: Allergy & Precautions, NPO status , Patient's Chart, lab work & pertinent test results  History of Anesthesia Complications Negative for: history of anesthetic complications  Airway Mallampati: II  TM Distance: >3 FB Neck ROM: Full    Dental no notable dental hx.    Pulmonary neg pulmonary ROS, neg sleep apnea, neg COPD,    breath sounds clear to auscultation- rhonchi (-) wheezing      Cardiovascular Exercise Tolerance: Good (-) hypertension(-) CAD, (-) Past MI, (-) Cardiac Stents and (-) CABG  Rhythm:Regular Rate:Normal - Systolic murmurs and - Diastolic murmurs    Neuro/Psych  Headaches, neg Seizures negative psych ROS   GI/Hepatic negative GI ROS, Neg liver ROS,   Endo/Other  negative endocrine ROSneg diabetes  Renal/GU negative Renal ROS     Musculoskeletal  (+) Arthritis ,   Abdominal (+) - obese,   Peds  Hematology negative hematology ROS (+)   Anesthesia Other Findings Past Medical History: No date: Diverticulosis No date: Fibroids No date: Migraines No date: Osteoarthritis     Comment:  right knee   Reproductive/Obstetrics                             Anesthesia Physical Anesthesia Plan  ASA: II  Anesthesia Plan: General   Post-op Pain Management:    Induction: Intravenous  PONV Risk Score and Plan: 2 and Propofol infusion  Airway Management Planned: Natural Airway  Additional Equipment:   Intra-op Plan:   Post-operative Plan:   Informed Consent: I have reviewed the patients History and Physical, chart, labs and discussed the procedure including the risks, benefits and alternatives for the proposed anesthesia with the patient or authorized representative who has indicated his/her understanding and acceptance.     Dental advisory given  Plan Discussed with: CRNA  and Anesthesiologist  Anesthesia Plan Comments:         Anesthesia Quick Evaluation

## 2020-09-02 NOTE — Op Note (Signed)
[facility] Gastroenterology Patient Name: Kaitlyn Forbes Procedure Date: 09/02/2020 10:37 AM MRN: [id number] Account #: [id number] Date of Birth: Oct 15, 1956 Admit Type: Outpatient Age: 64 Room: [facility] ENDO ROOM 4 Gender: Female Note Status: Finalized Procedure:             Colonoscopy Indications:           Screening for colorectal malignant neoplasm Providers:             Jonathon Bellows MD, MD Referring MD:          Janalyn Harder. Jerelene Redden, NP (Referring MD) Medicines:             Monitored Anesthesia Care Complications:         No immediate complications. Procedure:             Pre-Anesthesia Assessment:                        - Prior to the procedure, a History and Physical was                         performed, and patient medications, allergies and                         sensitivities were reviewed. The patient's tolerance                         of previous anesthesia was reviewed.                        - The risks and benefits of the procedure and the                         sedation options and risks were discussed with the                         patient. All questions were answered and informed                         consent was obtained.                        - ASA Grade Assessment: II - A patient with mild                         systemic disease.                        After obtaining informed consent, the colonoscope was                         passed under direct vision. Throughout the procedure,                         the patient's blood pressure, pulse, and oxygen                         saturations were monitored continuously. The                         Colonoscope was introduced through the anus  and                         advanced to the the cecum, identified by the                         appendiceal orifice. The colonoscopy was performed                         with ease. The patient tolerated the procedure well.                         The  quality of the bowel preparation was excellent. Findings:      The perianal and digital rectal examinations were normal.      The entire examined colon appeared normal on direct and retroflexion       views. Impression:            - The entire examined colon is normal on direct and                         retroflexion views.                        - No specimens collected. Recommendation:        - Discharge patient to home (with escort).                        - Resume previous diet.                        - Continue present medications.                        - Repeat colonoscopy in 10 years for screening                         purposes. Procedure Code(s):     --- Professional ---                        [id number], Colonoscopy, flexible; diagnostic, including                         collection of specimen(s) by brushing or washing, when                         performed (separate procedure) Diagnosis Code(s):     --- Professional ---                        Z12.11, Encounter for screening for malignant neoplasm                         of colon CPT copyright 2019 American Medical Association. All rights reserved. The codes documented in this report are preliminary and upon coder review may  be revised to meet current compliance requirements. Jonathon Bellows, MD Jonathon Bellows MD, MD 09/02/2020 11:00:57 AM This report has been signed electronically. Number of Addenda: 0 Note Initiated On: 09/02/2020 10:37 AM Scope Withdrawal Time: 0 hours 8 minutes 4 seconds  Total Procedure Duration: 0 hours 11 minutes 41 seconds  Estimated Blood Loss:  Estimated blood loss: none.      [facility]

## 2020-09-02 NOTE — Transfer of Care (Signed)
Immediate Anesthesia Transfer of Care Note  Patient: Kaitlyn Forbes  Procedure(s) Performed: COLONOSCOPY WITH PROPOFOL (N/A )  Patient Location: PACU  Anesthesia Type:General  Level of Consciousness: sedated  Airway & Oxygen Therapy: Patient Spontanous Breathing and Patient connected to nasal cannula oxygen  Post-op Assessment: Report given to RN and Post -op Vital signs reviewed and stable  Post vital signs: Reviewed and stable  Last Vitals:  Vitals Value Taken Time  BP    Temp    Pulse    Resp    SpO2      Last Pain:  Vitals:   09/02/20 0939  TempSrc: Temporal  PainSc: 0-No pain         Complications: No complications documented.

## 2020-09-05 ENCOUNTER — Encounter: Payer: Self-pay | Admitting: Gastroenterology

## 2020-09-05 ENCOUNTER — Telehealth: Payer: Self-pay

## 2020-09-05 NOTE — Telephone Encounter (Signed)
Pt calling; needs order for mammogram at [facility].  Annual is sched for 11/11/20.  [phone]  Left pt detailed msg that [facility] is not in office today but will be tomorrow pm.  Msg will be forwarded to [facility].

## 2020-09-06 ENCOUNTER — Other Ambulatory Visit: Payer: Self-pay | Admitting: Obstetrics

## 2020-09-06 DIAGNOSIS — Z01419 Encounter for gynecological examination (general) (routine) without abnormal findings: Secondary | ICD-10-CM

## 2020-09-06 DIAGNOSIS — Z1239 Encounter for other screening for malignant neoplasm of breast: Secondary | ICD-10-CM

## 2020-09-13 DIAGNOSIS — Z01419 Encounter for gynecological examination (general) (routine) without abnormal findings: Secondary | ICD-10-CM

## 2020-09-13 DIAGNOSIS — Z1239 Encounter for other screening for malignant neoplasm of breast: Secondary | ICD-10-CM

## 2020-09-14 ENCOUNTER — Other Ambulatory Visit: Payer: Self-pay | Admitting: Obstetrics

## 2020-09-14 DIAGNOSIS — Z1239 Encounter for other screening for malignant neoplasm of breast: Secondary | ICD-10-CM

## 2020-11-03 ENCOUNTER — Ambulatory Visit
Admission: RE | Admit: 2020-11-03 | Discharge: 2020-11-03 | Disposition: A | Discharge status: Home / Self Care | Payer: 59 | Source: Ambulatory Visit | Attending: Obstetrics | Admitting: Obstetrics

## 2020-11-03 ENCOUNTER — Other Ambulatory Visit: Payer: Self-pay

## 2020-11-03 DIAGNOSIS — Z1239 Encounter for other screening for malignant neoplasm of breast: Secondary | ICD-10-CM

## 2020-11-03 DIAGNOSIS — Z1231 Encounter for screening mammogram for malignant neoplasm of breast: Secondary | ICD-10-CM | POA: Diagnosis present

## 2020-11-09 ENCOUNTER — Ambulatory Visit: Payer: 59 | Admitting: Obstetrics

## 2020-11-11 ENCOUNTER — Ambulatory Visit: Payer: 59 | Admitting: Obstetrics

## 2020-11-18 ENCOUNTER — Encounter: Payer: Self-pay | Admitting: Obstetrics

## 2020-11-18 ENCOUNTER — Other Ambulatory Visit: Payer: Self-pay

## 2020-11-18 ENCOUNTER — Ambulatory Visit (INDEPENDENT_AMBULATORY_CARE_PROVIDER_SITE_OTHER): Payer: 59 | Admitting: Obstetrics

## 2020-11-18 VITALS — BP 114/70 | Ht 62.0 in | Wt 165.2 lb

## 2020-11-18 DIAGNOSIS — Z124 Encounter for screening for malignant neoplasm of cervix: Secondary | ICD-10-CM | POA: Diagnosis not present

## 2020-11-18 DIAGNOSIS — Z01419 Encounter for gynecological examination (general) (routine) without abnormal findings: Secondary | ICD-10-CM | POA: Diagnosis not present

## 2020-11-18 NOTE — Progress Notes (Signed)
Routine Annual Gynecology Examination   PCP: Marval Regal, NP  Chief Complaint:  Chief Complaint  Patient presents with  . Gynecologic Exam    Annual exam    History of Present Illness: Patient is a 65 y.o. G2P1011 presents for annual exam. The patient has no complaints today. She has just retired from [employer] where she worked for [employer] 25 years. She is married with one adult child. She is enjoying retirement.  Menopausal bleeding: denies  Menopausal symptoms: denies  Breast symptoms: denies  Last pap smear: 1 years ago.  Result Normal  Last mammogram: recently done a  month ago.  Result Normal  Past Medical History:  Diagnosis Date  . Diverticulosis   . Fibroids   . Migraines   . Osteoarthritis    right knee    Past Surgical History:  Procedure Laterality Date  . ABDOMINAL HYSTERECTOMY  02/1997   TAH for fibroids/ Dr Rayford Halsted  . COLONOSCOPY  09/01/2010   diverticulosis  . COLONOSCOPY WITH PROPOFOL N/A 09/02/2020   Procedure: COLONOSCOPY WITH PROPOFOL;  Surgeon: Jonathon Bellows, MD;  Location: [facility] ENDOSCOPY;  Service: Gastroenterology;  Laterality: N/A;  . ECTOPIC PREGNANCY SURGERY Left 11/1990   with left salpingectomy  . ESOPHAGOGASTRODUODENOSCOPY  09/01/2010   Dr Donnella Sham. Prior EGD for anemia work up by Dr Nicolasa Ducking.  Marland Kitchen HYSTEROSALPINGOGRAM  01/1991   distal obstruction of right Fallopian tube  . INCISION AND DRAINAGE     left bartholin cyst/ Word catheter    Prior to Admission medications   Medication Sig Start Date End Date Taking? Authorizing Provider  Ascorbic Acid (VITAMIN C) 1000 MG tablet Take 1,000 mg by mouth daily.   Yes [provider]  Biotin 1 MG CAPS Take by mouth.   Yes [provider]  Calcium-Vitamin D-Vitamin K (VIACTIV) 169-678-93 MG-UNT-MCG CHEW Chew by mouth.    Yes [provider]  Cholecalciferol (VITAMIN D) 50 MCG (2000 UT) CAPS Take by mouth.   Yes [provider]  Multiple Vitamin (MULTIVITAMIN)  tablet Take 1 tablet by mouth daily.   Yes [provider]  ondansetron (ZOFRAN ODT) 4 MG disintegrating tablet Take 1 tablet (4 mg total) by mouth every 8 (eight) hours as needed for nausea or vomiting. 06/17/20  Yes Marval Regal, NP  Probiotic Product (TRUBIOTICS PO) Take by mouth.   Yes [provider]    Allergies  Allergen Reactions  . Aspirin     Gynecologic History:  No LMP recorded. Patient has had a hysterectomy. Contraception: post menopausal status Last Pap: 2020. Results were: normal Last mammogram: 2020. Results were: normal  Obstetric History: G2P1011  Social History   Socioeconomic History  . Marital status: Married    Spouse name: Rosanna Randy  . Number of children: 1  . Years of education: Not on file  . Highest education level: Bachelor's degree (e.g., BA, AB, BS)  Occupational History  . Occupation: Fish farm manager  Tobacco Use  . Smoking status: Never Smoker  . Smokeless tobacco: Never Used  Vaping Use  . Vaping Use: Never used  Substance and Sexual Activity  . Alcohol use: Yes    Comment: occasional wine  . Drug use: No  . Sexual activity: Yes    Partners: Male    Birth control/protection: Surgical  Other Topics Concern  . Not on file  Social History Narrative   Patient's daughter currently living in [place], [place]   Social Determinants of Health   Financial Resource Strain: Not on  file  Food Insecurity: Not on file  Transportation Needs: Not on file  Physical Activity: Not on file  Stress: Not on file  Social Connections: Not on file  Intimate Partner Violence: Not on file    Family History  Problem Relation Age of Onset  . Hypertension Mother   . Arthritis Mother   . Diabetes Mother   . Diabetes Father   . COPD Father   . Breast cancer Neg Hx     ROS   Physical Exam Vitals: BP 114/70   Ht 5\' 2"  (1.575 m)   Wt 165 lb 3.2 oz (74.9 kg)   BMI 30.22 kg/m   OBGyn Exam   Female chaperone present for pelvic  and breast  portions of the physical exam  Results: AUDIT Questionnaire (screen for alcoholism): does not consume ETOH PHQ-9: deferred   Assessment and Plan:  65 y.o. G51P1011 female here for routine annual gynecologic examination  Plan: Problem List Items Addressed This Visit   None   Visit Diagnoses    Encounter for gynecological examination    -  Primary   Relevant Orders   IGP,CtNg,AptimaHPV   Cervical cancer screening       Relevant Orders   IGP,CtNg,AptimaHPV      Screening: -- Blood pressure screen normal -- Colonoscopy - due - managed by PCP -- Mammogram - recently completed -- Weight screening: overweight: continue to monitor -- Depression screening negative (PHQ-9) -- Nutrition: normal -- cholesterol screening: per PCP -- osteoporosis screening: not due -- tobacco screening: not using -- alcohol screening: AUDIT questionnaire indicates low-risk usage. -- family history of breast cancer screening: done. not at high risk. -- no evidence of domestic violence or intimate partner violence. -- STD screening: gonorrhea/chlamydia NAAT not collected per patient request. -- pap smear collected per ASCCP guidelines -- flu vaccine per her PCP -- HPV vaccination series: not eligilbe   We will see her back in one years for her next annual.   Imagene Riches, CNM  11/18/2020 1:16 PM   11/18/2020 1:12 PM

## 2020-11-23 ENCOUNTER — Encounter: Payer: Self-pay | Admitting: Obstetrics

## 2020-11-23 LAB — IGP,CTNG,APTIMAHPV
Chlamydia, Nuc. Acid Amp: NEGATIVE
Gonococcus by Nucleic Acid Amp: NEGATIVE
HPV Aptima: NEGATIVE

## 2021-01-02 ENCOUNTER — Ambulatory Visit: Payer: 59 | Admitting: Nurse Practitioner

## 2021-09-11 ENCOUNTER — Other Ambulatory Visit: Payer: Self-pay | Admitting: Obstetrics

## 2021-09-11 DIAGNOSIS — Z1231 Encounter for screening mammogram for malignant neoplasm of breast: Secondary | ICD-10-CM

## 2021-10-05 ENCOUNTER — Ambulatory Visit (INDEPENDENT_AMBULATORY_CARE_PROVIDER_SITE_OTHER): Admitting: Adult Health

## 2021-10-05 ENCOUNTER — Encounter: Payer: Self-pay | Admitting: Adult Health

## 2021-10-05 ENCOUNTER — Other Ambulatory Visit: Payer: Self-pay

## 2021-10-05 VITALS — BP 112/64 | HR 78 | Temp 98.0°F | Ht 62.0 in | Wt 146.2 lb

## 2021-10-05 DIAGNOSIS — R300 Dysuria: Secondary | ICD-10-CM

## 2021-10-05 DIAGNOSIS — Z0000 Encounter for general adult medical examination without abnormal findings: Secondary | ICD-10-CM | POA: Diagnosis not present

## 2021-10-05 DIAGNOSIS — E559 Vitamin D deficiency, unspecified: Secondary | ICD-10-CM

## 2021-10-05 DIAGNOSIS — E785 Hyperlipidemia, unspecified: Secondary | ICD-10-CM | POA: Diagnosis not present

## 2021-10-05 DIAGNOSIS — Z6826 Body mass index (BMI) 26.0-26.9, adult: Secondary | ICD-10-CM | POA: Diagnosis not present

## 2021-10-05 HISTORY — DX: Dysuria: R30.0

## 2021-10-05 LAB — COMPREHENSIVE METABOLIC PANEL
ALT: 14 U/L (ref 0–35)
AST: 23 U/L (ref 0–37)
Albumin: 4.1 g/dL (ref 3.5–5.2)
Alkaline Phosphatase: 61 U/L (ref 39–117)
BUN: 20 mg/dL (ref 6–23)
CO2: 30 mEq/L (ref 19–32)
Calcium: 9.5 mg/dL (ref 8.4–10.5)
Chloride: 102 mEq/L (ref 96–112)
Creatinine, Ser: 0.95 mg/dL (ref 0.40–1.20)
GFR: 63.29 mL/min (ref >60.00)
Glucose, Bld: 78 mg/dL (ref 70–99)
Potassium: 4.6 mEq/L (ref 3.5–5.1)
Sodium: 139 mEq/L (ref 135–145)
Total Bilirubin: 0.6 mg/dL (ref 0.2–1.2)
Total Protein: 6.9 g/dL (ref 6.0–8.3)

## 2021-10-05 LAB — CBC WITH DIFFERENTIAL/PLATELET
Basophils Absolute: 0.1 10*3/uL (ref 0.0–0.1)
Basophils Relative: 1.2 % (ref 0.0–3.0)
Eosinophils Absolute: 0.1 10*3/uL (ref 0.0–0.7)
Eosinophils Relative: 1.1 % (ref 0.0–5.0)
HCT: 42.8 % (ref 36.0–46.0)
Hemoglobin: 14.1 g/dL (ref 12.0–15.0)
Lymphocytes Relative: 34.1 % (ref 12.0–46.0)
Lymphs Abs: 1.5 10*3/uL (ref 0.7–4.0)
MCHC: 33 g/dL (ref 30.0–36.0)
MCV: 82.7 fl (ref 78.0–100.0)
Monocytes Absolute: 0.4 10*3/uL (ref 0.1–1.0)
Monocytes Relative: 8.3 % (ref 3.0–12.0)
Neutro Abs: 2.5 10*3/uL (ref 1.4–7.7)
Neutrophils Relative %: 55.3 % (ref 43.0–77.0)
Platelets: 247 10*3/uL (ref 150.0–400.0)
RBC: 5.17 Mil/uL — ABNORMAL HIGH (ref 3.87–5.11)
RDW: 14.4 % (ref 11.5–15.5)
WBC: 4.5 10*3/uL (ref 4.0–10.5)

## 2021-10-05 LAB — VITAMIN D 25 HYDROXY (VIT D DEFICIENCY, FRACTURES): VITD: 57.34 ng/mL (ref 30.00–100.00)

## 2021-10-05 LAB — LIPID PANEL
Cholesterol: 213 mg/dL — ABNORMAL HIGH (ref 0–200)
HDL: 64.4 mg/dL (ref >39.00)
LDL Cholesterol: 138 mg/dL — ABNORMAL HIGH (ref 0–99)
NonHDL: 148.54
Total CHOL/HDL Ratio: 3
Triglycerides: 51 mg/dL (ref 0.0–149.0)
VLDL: 10.2 mg/dL (ref 0.0–40.0)

## 2021-10-05 LAB — TSH: TSH: 0.69 u[IU]/mL (ref 0.35–5.50)

## 2021-10-05 LAB — HEMOGLOBIN A1C: Hgb A1c MFr Bld: 5.8 % (ref 4.6–6.5)

## 2021-10-05 NOTE — Progress Notes (Signed)
Vitamin d within normal limits.  TSH is within normal limits.  CMP within normal limits. Total cholesterol and LDL elevated.  Discuss lifestyle modification with patient e.g. increase exercise, fiber, fruits, vegetables, lean meat, and omega 3/fish intake and decrease saturated fat.  If patient following strict diet and exercise program already please schedule follow up appointment with primary care physician. Consider low dose statin such as crestor 5 mg once daily if she is willing. Will need CMP recheck in 6 weeks if statin started and should monitor for increased muscle aches/ pains and report if having.  Cbc is ok.  A1C is on diabetic.  Recheck lipid panel in 3 months.

## 2021-10-05 NOTE — Progress Notes (Signed)
New Patient Office Visit  Subjective:  Patient ID: Kaitlyn Forbes, female    DOB: 1956-08-16  Age: 65 y.o. MRN: [id number]  CC:  Chief Complaint  Patient presents with   Establish Care          HPI JUSTUS DUERR presents for Transfer of care office visit. She was previously seen by Denice Paradise NP-C who has since left this office.  Retired from [employer].  She feels well. She exercises 5- 6 days a week. She is married with one grown child.  Treated for alopecia - [facility].  Eye exam   Patient  denies any fever, body aches,chills, rash, chest pain, shortness of breath, nausea, vomiting, or diarrhea.  Covid vaccines up to date.  Declined flu and shingles vaccine.  Negative PAP smear and gynecological evaluation with CNM Rolly Salter 11/18/20 at [facility].  Mammogram also done with womens health reviewed as below from 11/03/2020. IMPRESSION:  No mammographic evidence of malignancy. A result letter of this  screening mammogram will be mailed directly to the patient.  RECOMMENDATION:  SBI-RADS CATEGORY  1: Negative.  Electronically Signed    By: Lajean Manes M.D.    On: 11/03/2020 12:00 creening mammogram in one year. (Code:[id number]-B-01Y)   Colonoscopy 09/02/21 with Dr. Bailey Mech.      Past Medical History:  Diagnosis Date   Diverticulosis    Fibroids    Migraines    Osteoarthritis    right knee    Past Surgical History:  Procedure Laterality Date   ABDOMINAL HYSTERECTOMY  02/1997   TAH for fibroids/ Dr Rayford Halsted   COLONOSCOPY  09/01/2010   diverticulosis   COLONOSCOPY WITH PROPOFOL N/A 09/02/2020   Procedure: COLONOSCOPY WITH PROPOFOL;  Surgeon: Jonathon Bellows, MD;  Location: [facility] ENDOSCOPY;  Service: Gastroenterology;  Laterality: N/A;   ECTOPIC PREGNANCY SURGERY Left 11/1990   with left salpingectomy   ESOPHAGOGASTRODUODENOSCOPY  09/01/2010   Dr Donnella Sham. Prior EGD for anemia work up by Dr Nicolasa Ducking.   HYSTEROSALPINGOGRAM  01/1991   distal obstruction of  right Fallopian tube   INCISION AND DRAINAGE     left bartholin cyst/ Word catheter    Family History  Problem Relation Age of Onset   Hypertension Mother    Arthritis Mother    Diabetes Mother    Diabetes Father    COPD Father    Breast cancer Neg Hx     Social History   Socioeconomic History   Marital status: Married    Spouse name: Kaitlyn Forbes   Number of children: 1   Years of education: Not on file   Highest education level: Bachelor's degree (e.g., BA, AB, BS)  Occupational History   Occupation: Fish farm manager  Tobacco Use   Smoking status: Never   Smokeless tobacco: Never  Vaping Use   Vaping Use: Never used  Substance and Sexual Activity   Alcohol use: Yes    Comment: occasional wine   Drug use: No   Sexual activity: Yes    Partners: Male    Birth control/protection: Surgical  Other Topics Concern   Not on file  Social History Narrative   Patient's daughter currently living in [place], [place]   Social Determinants of Health   Financial Resource Strain: Not on file  Food Insecurity: Not on file  Transportation Needs: Not on file  Physical Activity: Not on file  Stress: Not on file  Social Connections: Not on file  Intimate Partner Violence: Not on file  ROS Review of Systems  Constitutional: Negative.   HENT: Negative.    Respiratory: Negative.    Cardiovascular: Negative.   Gastrointestinal: Negative.   Genitourinary: Negative.   Musculoskeletal: Negative.   Skin:        Improved. Alopecia followed at [place]   Neurological: Negative.   Hematological: Negative.   Psychiatric/Behavioral: Negative.     Objective:   Today's Vitals: BP 112/64 (BP Location: Left Arm, Patient Position: Sitting, Cuff Size: Normal)   Pulse 78   Temp 98 F (36.7 C) (Oral)   Ht 5\' 2"  (1.575 m)   Wt 146 lb 4 oz (66.3 kg)   SpO2 99%   BMI 26.75 kg/m   Physical Exam Vitals reviewed.  Constitutional:      General: She is not in acute distress.     Appearance: She is well-developed. She is not ill-appearing, toxic-appearing or diaphoretic.     Interventions: She is not intubated. HENT:     Head: Normocephalic and atraumatic.     Right Ear: Tympanic membrane, ear canal and external ear normal.     Left Ear: Tympanic membrane, ear canal and external ear normal.     Nose: Nose normal.     Mouth/Throat:     Mouth: Mucous membranes are moist.     Pharynx: No oropharyngeal exudate or posterior oropharyngeal erythema.  Eyes:     General: Lids are normal. No scleral icterus.       Right eye: No discharge.        Left eye: No discharge.     Extraocular Movements: Extraocular movements intact.     Conjunctiva/sclera: Conjunctivae normal.     Right eye: Right conjunctiva is not injected. No exudate or hemorrhage.    Left eye: Left conjunctiva is not injected. No exudate or hemorrhage.    Pupils: Pupils are equal, round, and reactive to light.  Neck:     Thyroid: No thyroid mass or thyromegaly.     Vascular: Normal carotid pulses. No carotid bruit, hepatojugular reflux or JVD.     Trachea: Trachea and phonation normal. No tracheal tenderness or tracheal deviation.     Meningeal: Brudzinski's sign and Kernig's sign absent.  Cardiovascular:     Rate and Rhythm: Normal rate and regular rhythm.     Pulses: Normal pulses.          Radial pulses are 2+ on the right side and 2+ on the left side.       Dorsalis pedis pulses are 2+ on the right side and 2+ on the left side.       Posterior tibial pulses are 2+ on the right side and 2+ on the left side.     Heart sounds: Normal heart sounds, S1 normal and S2 normal. Heart sounds not distant. No murmur heard.   No friction rub. No gallop.  Pulmonary:     Effort: Pulmonary effort is normal. No tachypnea, bradypnea, accessory muscle usage or respiratory distress. She is not intubated.     Breath sounds: Normal breath sounds. No stridor. No wheezing, rhonchi or rales.  Chest:     Chest wall: No  tenderness.  Abdominal:     General: Bowel sounds are normal. There is no distension or abdominal bruit.     Palpations: Abdomen is soft. There is no shifting dullness, fluid wave, hepatomegaly, splenomegaly, mass or pulsatile mass.     Tenderness: There is no abdominal tenderness. There is no right CVA tenderness, left CVA tenderness,  guarding or rebound.     Hernia: No hernia is present.  Musculoskeletal:        General: No tenderness or deformity. Normal range of motion.     Cervical back: Full passive range of motion without pain, normal range of motion and neck supple. No edema, erythema or rigidity. No spinous process tenderness or muscular tenderness. Normal range of motion.     Right lower leg: No edema.     Left lower leg: No edema.  Lymphadenopathy:     Head:     Right side of head: No submental, submandibular, tonsillar, preauricular, posterior auricular or occipital adenopathy.     Left side of head: No submental, submandibular, tonsillar, preauricular, posterior auricular or occipital adenopathy.     Cervical: No cervical adenopathy.     Right cervical: No superficial, deep or posterior cervical adenopathy.    Left cervical: No superficial, deep or posterior cervical adenopathy.     Upper Body:     Right upper body: No supraclavicular or pectoral adenopathy.     Left upper body: No supraclavicular or pectoral adenopathy.  Skin:    General: Skin is warm and dry.     Coloration: Skin is not jaundiced or pale.     Findings: No abrasion, bruising, burn, ecchymosis, erythema, lesion, petechiae or rash.     Nails: There is no clubbing.  Neurological:     Mental Status: She is alert and oriented to person, place, and time.     GCS: GCS eye subscore is 4. GCS verbal subscore is 5. GCS motor subscore is 6.     Cranial Nerves: No cranial nerve deficit.     Sensory: No sensory deficit.     Motor: No weakness, tremor, atrophy, abnormal muscle tone or seizure activity.      Coordination: Coordination normal.     Gait: Gait normal.     Deep Tendon Reflexes: Reflexes are normal and symmetric. Reflexes normal. Babinski sign absent on the right side. Babinski sign absent on the left side.     Reflex Scores:      Tricep reflexes are 2+ on the right side and 2+ on the left side.      Bicep reflexes are 2+ on the right side and 2+ on the left side.      Brachioradialis reflexes are 2+ on the right side and 2+ on the left side.      Patellar reflexes are 2+ on the right side and 2+ on the left side.      Achilles reflexes are 2+ on the right side and 2+ on the left side. Psychiatric:        Mood and Affect: Mood normal.        Speech: Speech normal.        Behavior: Behavior normal.        Thought Content: Thought content normal.        Judgment: Judgment normal.    Assessment & Plan:   Problem List Items Addressed This Visit       Other   Routine adult health maintenance - Primary   Relevant Orders   Hemoglobin A1c   TSH   CBC with Differential/Platelet   Comprehensive metabolic panel   Vitamin D deficiency   Relevant Orders   VITAMIN D 25 Hydroxy (Vit-D Deficiency, Fractures)   Hyperlipidemia   Relevant Orders   Lipid panel   Body mass index 26.0-26.9, adult   Dysuria   Relevant Orders  Urinalysis, Routine w reflex microscopic    Outpatient Encounter Medications as of 10/05/2021  Medication Sig   Ascorbic Acid (VITAMIN C) 1000 MG tablet Take 1,000 mg by mouth daily.   Calcium-Vitamin D-Vitamin K (VIACTIV) [phone] MG-UNT-MCG CHEW Chew by mouth.    Multiple Vitamin (MULTIVITAMIN) tablet Take 1 tablet by mouth daily.   ondansetron (ZOFRAN ODT) 4 MG disintegrating tablet Take 1 tablet (4 mg total) by mouth every 8 (eight) hours as needed for nausea or vomiting.   Probiotic Product (TRUBIOTICS PO) Take by mouth.   [DISCONTINUED] Biotin 1 MG CAPS Take by mouth.   [DISCONTINUED] Cholecalciferol (VITAMIN D) 50 MCG (2000 UT) CAPS Take by mouth.    No facility-administered encounter medications on file as of 10/05/2021.  Patient has OB/GYN annual physical scheduled with [facility] in January 2023.  She also has mammogram scheduled for November 03, 2021. She declines the influenza vaccine today.  She has not had Shingrix vaccine she declined that as well.  Risk versus benefits were discussed. Patient has annual eye exam scheduled for 2023.  She goes annually.  She is up-to-date on healthcare maintenance. Labs were ordered and patient is advised she will go for labs as well. Will need follow-up sooner if any labs are abnormal. Employee # [employer] [phone] patient is retired from [employer]: Return in about 6 months (around 04/05/2022), or if symptoms worsen or fail to improve, for at any time for any worsening symptoms, Go to Emergency room/ urgent care if worse.    Red Flags discussed. The patient was given clear instructions to go to ER or return to medical center if any red flags develop, symptoms do not improve, worsen or new problems develop. They verbalized understanding.  Marcille Buffy, FNP

## 2021-10-05 NOTE — Patient Instructions (Signed)

## 2021-10-06 ENCOUNTER — Encounter: Payer: Self-pay | Admitting: *Deleted

## 2021-10-06 LAB — URINALYSIS, ROUTINE W REFLEX MICROSCOPIC
Bilirubin, UA: NEGATIVE
Glucose, UA: NEGATIVE
Ketones, UA: NEGATIVE
Leukocytes,UA: NEGATIVE
Nitrite, UA: NEGATIVE
Protein,UA: NEGATIVE
RBC, UA: NEGATIVE
Specific Gravity, UA: <=1.005 — AB (ref 1.005–1.030)
Urobilinogen, Ur: 0.2 mg/dL (ref 0.2–1.0)
pH, UA: 7.5 (ref 5.0–7.5)

## 2021-11-07 ENCOUNTER — Other Ambulatory Visit: Payer: Self-pay

## 2021-11-07 ENCOUNTER — Ambulatory Visit
Admission: RE | Admit: 2021-11-07 | Discharge: 2021-11-07 | Disposition: A | Discharge status: Home / Self Care | Source: Ambulatory Visit | Attending: Obstetrics | Admitting: Obstetrics

## 2021-11-07 DIAGNOSIS — Z1231 Encounter for screening mammogram for malignant neoplasm of breast: Secondary | ICD-10-CM | POA: Insufficient documentation

## 2021-12-25 ENCOUNTER — Other Ambulatory Visit: Payer: Self-pay

## 2021-12-25 ENCOUNTER — Encounter: Payer: Self-pay | Admitting: Obstetrics

## 2021-12-25 ENCOUNTER — Ambulatory Visit (INDEPENDENT_AMBULATORY_CARE_PROVIDER_SITE_OTHER): Admitting: Obstetrics

## 2021-12-25 VITALS — BP 122/74 | Ht 62.0 in | Wt 141.0 lb

## 2021-12-25 DIAGNOSIS — Z01419 Encounter for gynecological examination (general) (routine) without abnormal findings: Secondary | ICD-10-CM

## 2021-12-25 DIAGNOSIS — Z124 Encounter for screening for malignant neoplasm of cervix: Secondary | ICD-10-CM | POA: Diagnosis not present

## 2021-12-25 NOTE — Progress Notes (Signed)
Gynecology Annual Exam  PCP: Doreen Beam, FNP  Chief Complaint:  Chief Complaint  Patient presents with   Annual Exam    History of Present Illness:Patient is a 66 y.o. G2P1011 presents for annual exam. The patient has no complaints today.   LMP: No LMP recorded. Patient has had a hysterectomy.  The patient is not sexually active. She denies dyspareunia.  The patient does perform self breast exams.  There is no notable family history of breast or ovarian cancer in her family.  The patient wears seatbelts: yes.   The patient has regular exercise: yes.    The patient denies current symptoms of depression.     Review of Systems: Review of Systems  Constitutional: Negative.   HENT: Negative.    Eyes: Negative.   Respiratory: Negative.    Cardiovascular: Negative.   Gastrointestinal: Negative.   Genitourinary: Negative.   Musculoskeletal: Negative.   Skin: Negative.   Neurological: Negative.   Endo/Heme/Allergies: Negative.   Psychiatric/Behavioral: Negative.     Past Medical History:  Patient Active Problem List   Diagnosis Date Noted   Body mass index 26.0-26.9, adult 10/05/2021   Dysuria 10/05/2021   Routine adult health maintenance 07/05/2020   Vitamin D deficiency 07/05/2020   Hyperlipidemia 07/05/2020   Need for Tdap vaccination 07/05/2020   Migraine without aura and without status migrainosus, not intractable 06/15/2020   Encounter for medical examination to establish care 06/15/2020   Screening for HIV (human immunodeficiency virus) 06/15/2020   Encounter for HCV screening test for low risk patient 06/15/2020   Colon cancer screening 06/15/2020   Osteoarthritis of knee 08/05/2017    right knee     Past Surgical History:  Past Surgical History:  Procedure Laterality Date   ABDOMINAL HYSTERECTOMY  02/1997   TAH for fibroids/ Dr Rayford Halsted   COLONOSCOPY  09/01/2010   diverticulosis   COLONOSCOPY WITH PROPOFOL N/A 09/02/2020   Procedure:  COLONOSCOPY WITH PROPOFOL;  Surgeon: Jonathon Bellows, MD;  Location: [facility] ENDOSCOPY;  Service: Gastroenterology;  Laterality: N/A;   ECTOPIC PREGNANCY SURGERY Left 11/1990   with left salpingectomy   ESOPHAGOGASTRODUODENOSCOPY  09/01/2010   Dr Donnella Sham. Prior EGD for anemia work up by Dr Nicolasa Ducking.   HYSTEROSALPINGOGRAM  01/1991   distal obstruction of right Fallopian tube   INCISION AND DRAINAGE     left bartholin cyst/ Word catheter    Gynecologic History:  No LMP recorded. Patient has had a hysterectomy. Last Pap: 2022Results were: NILM no abnormalities  Last mammogram: 2023 Results were: BI-RAD I  Obstetric History: G2P1011  Family History:  Family History  Problem Relation Age of Onset   Hypertension Mother    Arthritis Mother    Diabetes Mother    Diabetes Father    COPD Father    Breast cancer Neg Hx     Social History:  Social History   Socioeconomic History   Marital status: Married    Spouse name: Rosanna Randy   Number of children: 1   Years of education: Not on file   Highest education level: Bachelor's degree (e.g., BA, AB, BS)  Occupational History   Occupation: Fish farm manager  Tobacco Use   Smoking status: Never   Smokeless tobacco: Never  Vaping Use   Vaping Use: Never used  Substance and Sexual Activity   Alcohol use: Yes    Comment: occasional wine   Drug use: No   Sexual activity: Yes    Partners: Male    Birth  control/protection: Surgical  Other Topics Concern   Not on file  Social History Narrative   Patient's daughter currently living in [place], [place]   Social Determinants of Health   Financial Resource Strain: Not on file  Food Insecurity: Not on file  Transportation Needs: Not on file  Physical Activity: Not on file  Stress: Not on file  Social Connections: Not on file  Intimate Partner Violence: Not on file    Allergies:  Allergies  Allergen Reactions   Aspirin     Medications: Prior to Admission medications   Medication Sig  Start Date End Date Taking? Authorizing Provider  Ascorbic Acid (VITAMIN C) 1000 MG tablet Take 1,000 mg by mouth daily.   Yes [provider]  calcium carbonate (TITRALAC) 420 MG CHEW chewable tablet Chew 420 mg by mouth.   Yes [provider]  finasteride (PROPECIA) 1 MG tablet Take 1 mg by mouth daily.   Yes [provider]  fluocinolone (VANOS) 0.01 % cream Apply topically 2 (two) times daily.   Yes [provider]  hydroxychloroquine (PLAQUENIL) 200 MG tablet Take by mouth daily.   Yes [provider]  minoxidil (LONITEN) 2.5 MG tablet Take by mouth daily.   Yes [provider]  Multiple Vitamin (MULTIVITAMIN) tablet Take 1 tablet by mouth daily.   Yes [provider]  Omega-3 Fatty Acids (FISH OIL) 1000 MG CAPS Take by mouth.   Yes [provider]  ondansetron (ZOFRAN ODT) 4 MG disintegrating tablet Take 1 tablet (4 mg total) by mouth every 8 (eight) hours as needed for nausea or vomiting. 06/17/20  Yes Marval Regal, NP  Probiotic Product (TRUBIOTICS PO) Take by mouth.   Yes [provider]  Calcium-Vitamin D-Vitamin K (VIACTIV) 062-694-85 MG-UNT-MCG CHEW Chew by mouth.  Patient not taking: Reported on 12/25/2021    [provider]    Physical Exam Vitals: Blood pressure 122/74, height 5\' 2"  (1.575 m), weight 141 lb (64 kg).  General: NAD HEENT: normocephalic, anicteric Thyroid: no enlargement, no palpable nodules Pulmonary: No increased work of breathing, CTAB Cardiovascular: RRR, distal pulses 2+ Breast: Breast symmetrical, no tenderness, no palpable nodules or masses, no skin or nipple retraction present, no nipple discharge.  No axillary or supraclavicular lymphadenopathy. Abdomen: NABS, soft, non-tender, non-distended.  Umbilicus without lesions.  No hepatomegaly, splenomegaly or masses palpable. No evidence of hernia  Genitourinary:  External: Normal external female genitalia.  Normal  urethral meatus, normal Bartholin's and Skene's glands.    Vagina: Normal vaginal mucosa, no evidence of prolapse.    Cervix: Grossly normal in appearance, no bleeding  Uterus: Non-enlarged, mobile, normal contour.  No CMT  Adnexa: ovaries non-enlarged, no adnexal masses  Rectal: deferred  Lymphatic: no evidence of inguinal lymphadenopathy Extremities: no edema, erythema, or tenderness Neurologic: Grossly intact Psychiatric: mood appropriate, affect full  Female chaperone present for pelvic and breast  portions of the physical exam     Assessment: 66 y.o. G2P1011 routine annual exam  Plan: Problem List Items Addressed This Visit   None Visit Diagnoses     Cervical cancer screening    -  Primary   Women's annual routine gynecological examination           1) Mammogram - recommend yearly screening mammogram.  Mammogram Is up to date  2) STI screening  wasoffered and declined  3) ASCCP guidelines and rational discussed.  Patient opts for discontinue age >20 screening interval  4) Osteoporosis  - per USPTF  routine screening DEXA at age 1 Per her PCP  Consider FDA-approved medical therapies in postmenopausal women and men aged 22 years and older, based on the following: a) A hip or vertebral (clinical or morphometric) fracture b) T-score ? -2.5 at the femoral neck or spine after appropriate evaluation to exclude secondary causes C) Low bone mass (T-score between -1.0 and -2.5 at the femoral neck or spine) and a 10-year probability of a hip fracture ? 3% or a 10-year probability of a major osteoporosis-related fracture ? 20% based on the [place]-adapted WHO algorithm   5) Routine healthcare maintenance including cholesterol, diabetes screening discussed managed by PCP  6) Colonoscopy Per her PCP.  Screening recommended starting at age 43 for average risk individuals, age 35 for individuals deemed at increased risk (including African Americans) and recommended to continue until age  60.  For patient age 84-85 individualized approach is recommended.  Gold standard screening is via colonoscopy, Cologuard screening is an acceptable alternative for patient unwilling or unable to undergo colonoscopy.  "Colorectal cancer screening for average?risk adults: 2018 guideline update from the [place]: A Cancer Journal for Clinicians: Apr 03, 2017   7) Return in about 1 year (around 12/25/2022) for annual.    Imagene Riches, CNM  12/25/2021 9:16 AM   [facility] 12/25/2021, 9:15 AM

## 2022-05-23 IMAGING — MG DIGITAL SCREENING BILAT W/ TOMO W/ CAD
6 of 10 series · 6 of 30 positions shown · non-contrast
Comparison: Previous exam(s).

CLINICAL DATA: Screening.

EXAM:
DIGITAL SCREENING BILATERAL MAMMOGRAM WITH TOMO AND CAD

[R CC synth-2D]
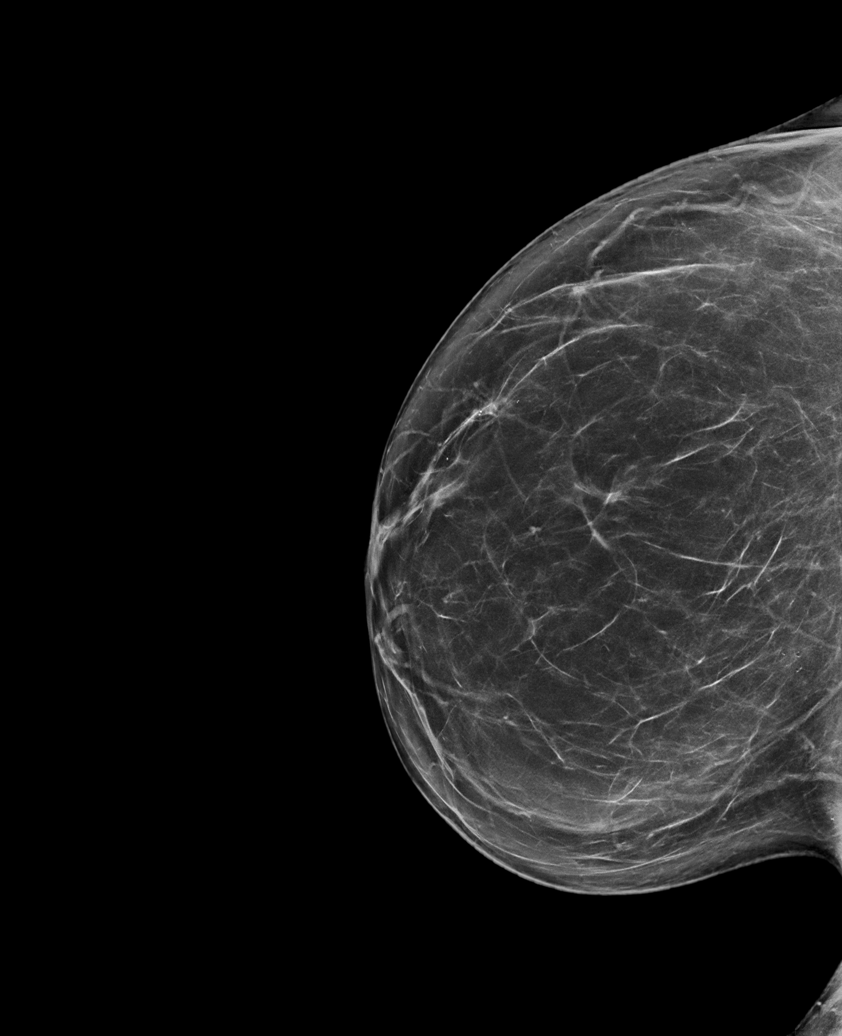

[L MLO synth-2D (1 of 2)]
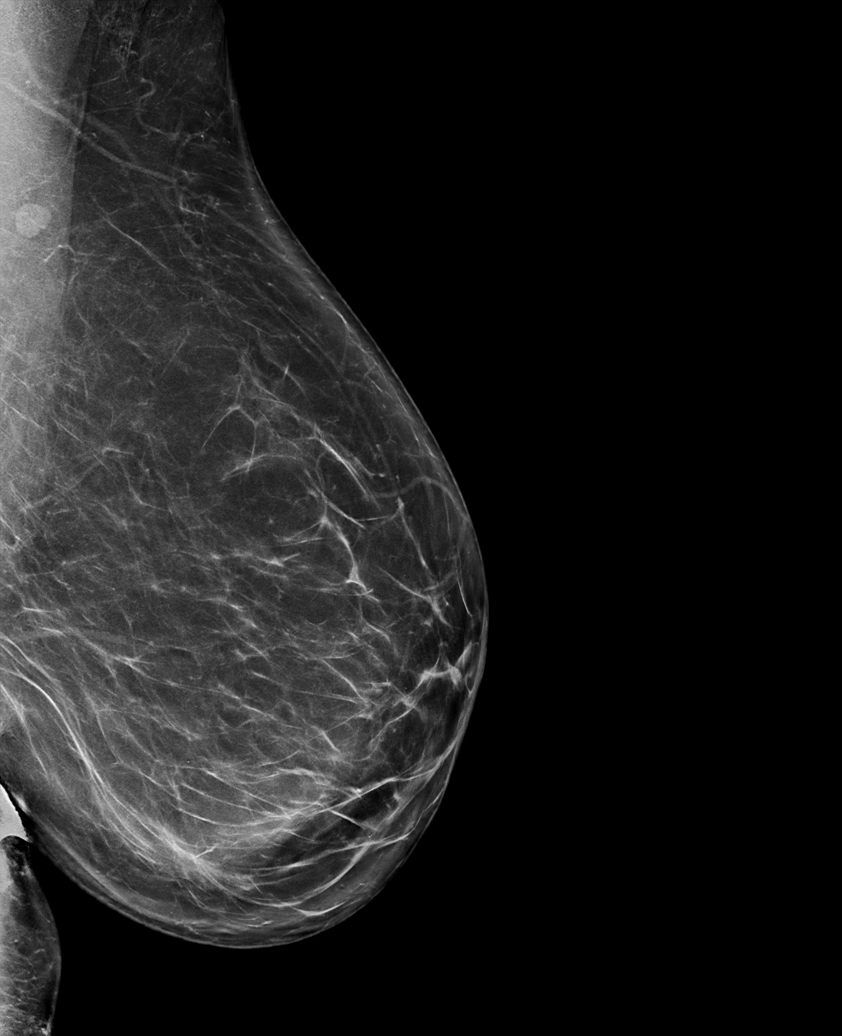

[L MLO synth-2D (2 of 2)]
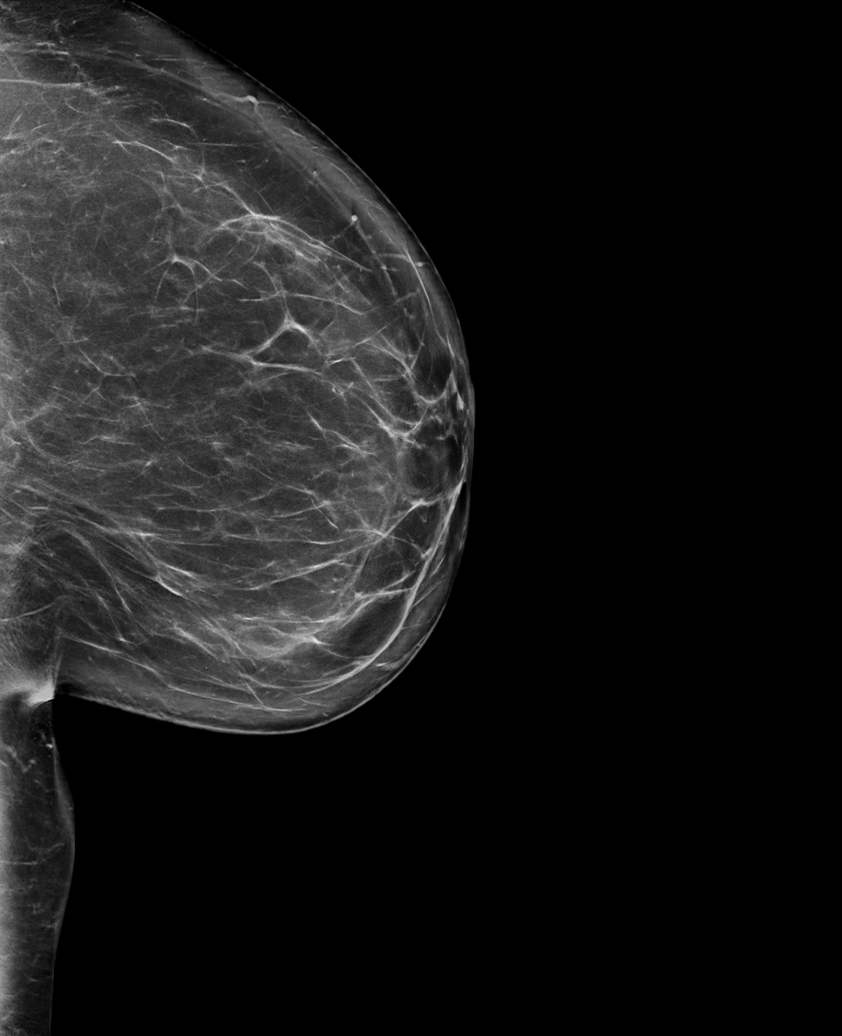

[R MLO synth-2D]
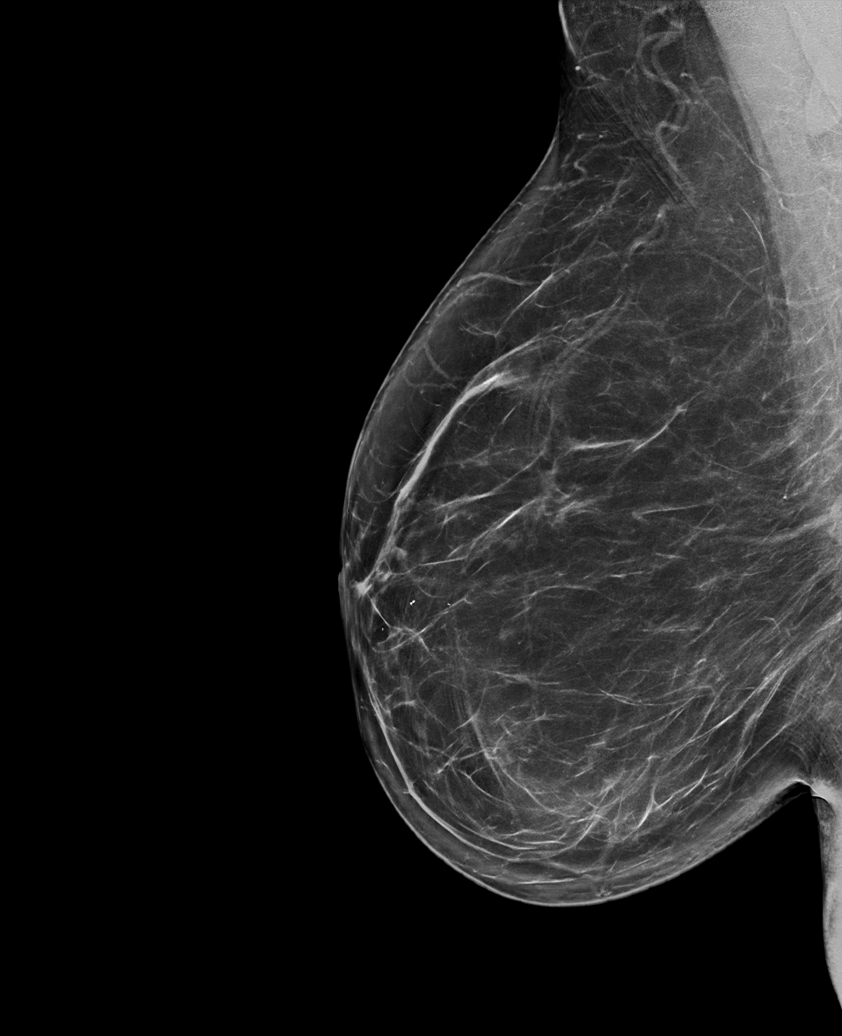

[L CC synth-2D]
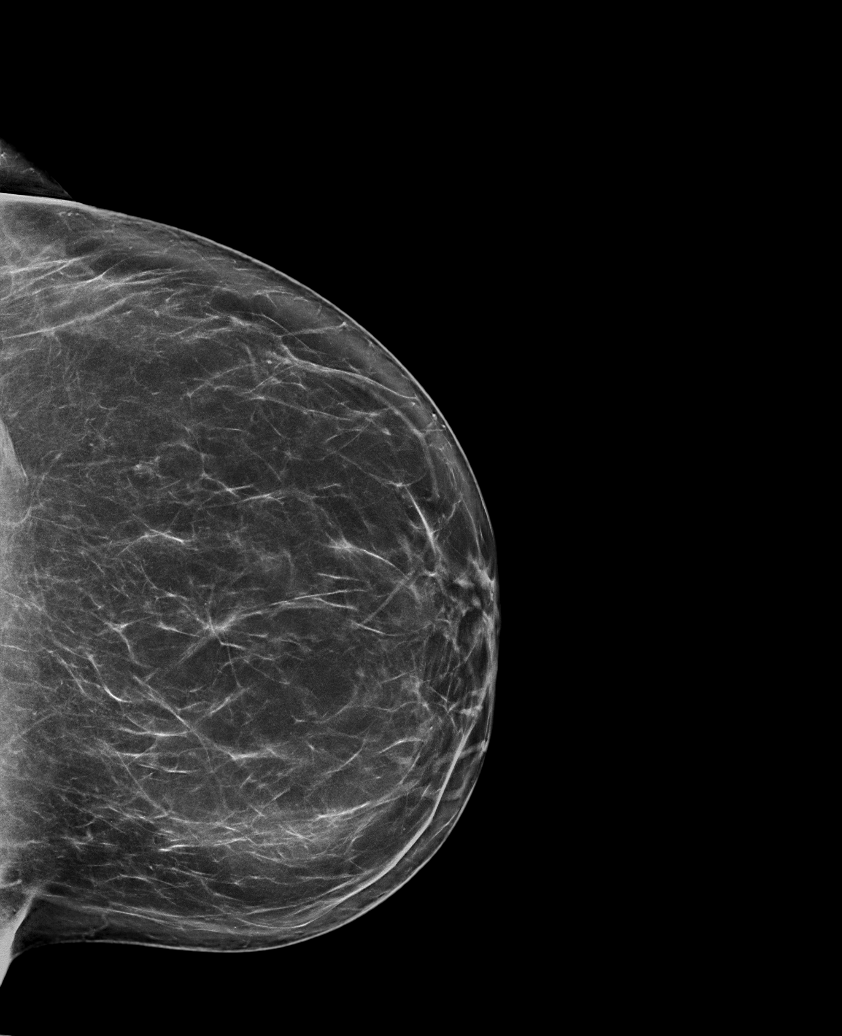

[R CC tomo · tomo slice 45/88.0]
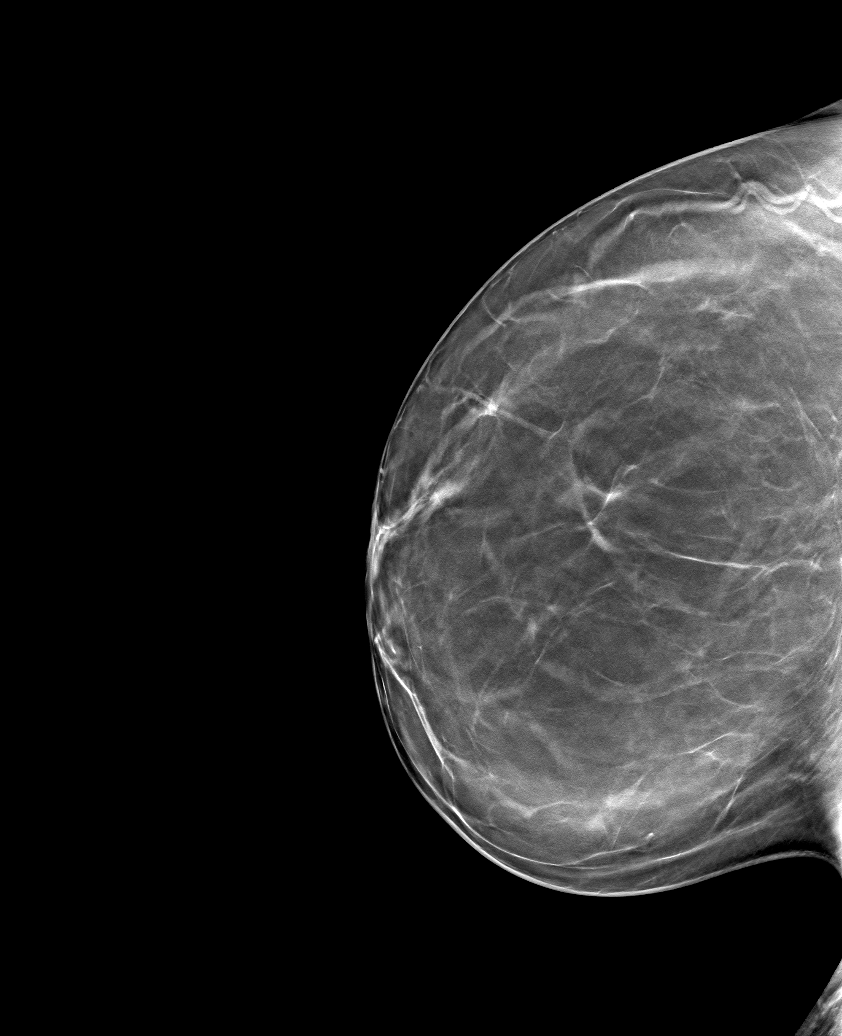

[6 of 30 positions shown; findings below may reference images not displayed]

ACR Breast Density Category b: There are scattered areas of
fibroglandular density.
FINDINGS: There are no findings suspicious for malignancy. Images were
processed with CAD.
IMPRESSION: No mammographic evidence of malignancy. A result letter of this
screening mammogram will be mailed directly to the patient.

RECOMMENDATION:
Screening mammogram in one year. (Code:[id number])

BI-RADS CATEGORY  1: Negative.

## 2022-09-24 ENCOUNTER — Encounter: Admitting: Family Medicine

## 2022-10-04 ENCOUNTER — Other Ambulatory Visit: Payer: Self-pay | Admitting: Obstetrics

## 2022-10-04 DIAGNOSIS — Z1231 Encounter for screening mammogram for malignant neoplasm of breast: Secondary | ICD-10-CM

## 2022-10-08 ENCOUNTER — Encounter: Admitting: Adult Health

## 2022-11-02 NOTE — Telephone Encounter (Signed)
Caller name:   Relationship to patient: Can be reached: Pharmacy: Last visit:   Situation: (What circumstance or reason brought patient into the office?)   Background   Symptoms:    '@LASTVISITSECTIONDTEXT'$ @    Attributing factors (medication changes, positional changes, etc. )      Duration :   Pain Scale?  On 1-10 how woiuld you rate your pain? What makes it better or worse?    Blood pressure            Pulse             Temp           Dizziness : Orthostatic BP: Lying           Sitting         Standing      Urinary Symptoms POCT UA. Normal           Abnormal  Palpitations : Chest pain:  Increased Pulse : Get ECG . ( Make PCP aware first patient having chest pain).Marland Kitchenb

## 2022-11-08 ENCOUNTER — Encounter

## 2022-12-13 ENCOUNTER — Ambulatory Visit
Admission: RE | Admit: 2022-12-13 | Discharge: 2022-12-13 | Disposition: A | Discharge status: Home / Self Care | Payer: Medicare PPO | Source: Ambulatory Visit | Attending: Obstetrics | Admitting: Obstetrics

## 2022-12-13 DIAGNOSIS — Z1231 Encounter for screening mammogram for malignant neoplasm of breast: Secondary | ICD-10-CM | POA: Insufficient documentation

## 2023-01-08 ENCOUNTER — Other Ambulatory Visit (HOSPITAL_COMMUNITY)
Admission: RE | Admit: 2023-01-08 | Discharge: 2023-01-08 | Disposition: A | Discharge status: Home / Self Care | Payer: Medicare PPO | Source: Ambulatory Visit | Attending: Obstetrics | Admitting: Obstetrics

## 2023-01-08 ENCOUNTER — Ambulatory Visit (INDEPENDENT_AMBULATORY_CARE_PROVIDER_SITE_OTHER): Payer: Medicare PPO | Admitting: Obstetrics

## 2023-01-08 ENCOUNTER — Encounter: Payer: Self-pay | Admitting: Obstetrics

## 2023-01-08 VITALS — BP 112/65 | HR 84 | Resp 15 | Ht 62.0 in | Wt 133.9 lb

## 2023-01-08 DIAGNOSIS — Z01419 Encounter for gynecological examination (general) (routine) without abnormal findings: Secondary | ICD-10-CM

## 2023-01-08 DIAGNOSIS — Z124 Encounter for screening for malignant neoplasm of cervix: Secondary | ICD-10-CM

## 2023-01-08 NOTE — Addendum Note (Signed)
Addended by: Imagene Riches on: 01/08/2023 10:01 AM   Modules accepted: Orders

## 2023-01-08 NOTE — Progress Notes (Signed)
Gynecology Annual Exam  PCP: Imagene Riches, CNM  Chief Complaint:  Chief Complaint  Patient presents with   Annual Exam    History of Present Illness:Patient is a 67 y.o. G2P1011 presents for annual exam. The patient has no complaints today.   LMP: No LMP recorded. Patient has had a hysterectomy. She lives with her husband and is now retired. and has family in [place]. She goes to the gym every day. Has no grandchildren.  Her husband has also retired. They are not sexually intimate for the most part.  The patient is not sexually active. She denies dyspareunia.  The patient does perform self breast exams.  There is no notable family history of breast or ovarian cancer in her family.  The patient wears seatbelts: yes.   The patient has regular exercise: yes.    The patient denies current symptoms of depression.     Review of Systems: Review of Systems  Constitutional: Negative.   HENT: Negative.    Eyes: Negative.   Respiratory: Negative.    Cardiovascular: Negative.   Gastrointestinal: Negative.   Genitourinary: Negative.   Musculoskeletal: Negative.   Skin: Negative.   All other systems reviewed and are negative.   Past Medical History:  Patient Active Problem List   Diagnosis Date Noted   Body mass index 26.0-26.9, adult 10/05/2021   Dysuria 10/05/2021   Routine adult health maintenance 07/05/2020   Vitamin D deficiency 07/05/2020   Hyperlipidemia 07/05/2020   Need for Tdap vaccination 07/05/2020   Migraine without aura and without status migrainosus, not intractable 06/15/2020   Encounter for medical examination to establish care 06/15/2020   Screening for HIV (human immunodeficiency virus) 06/15/2020   Encounter for HCV screening test for low risk patient 06/15/2020   Colon cancer screening 06/15/2020   Osteoarthritis of knee 08/05/2017    right knee     Past Surgical History:  Past Surgical History:  Procedure Laterality Date   ABDOMINAL HYSTERECTOMY   02/1997   TAH for fibroids/ Dr Rayford Halsted   COLONOSCOPY  09/01/2010   diverticulosis   COLONOSCOPY WITH PROPOFOL N/A 09/02/2020   Procedure: COLONOSCOPY WITH PROPOFOL;  Surgeon: Jonathon Bellows, MD;  Location: [facility] ENDOSCOPY;  Service: Gastroenterology;  Laterality: N/A;   ECTOPIC PREGNANCY SURGERY Left 11/1990   with left salpingectomy   ESOPHAGOGASTRODUODENOSCOPY  09/01/2010   Dr Donnella Sham. Prior EGD for anemia work up by Dr Nicolasa Ducking.   HYSTEROSALPINGOGRAM  01/1991   distal obstruction of right Fallopian tube   INCISION AND DRAINAGE     left bartholin cyst/ Word catheter    Gynecologic History:  No LMP recorded. Patient has had a hysterectomy. Last Pap: Results were: NILM no abnormalities  Last mammogram: 2024 Results were: BI-RAD I  Obstetric History: G2P1011  Family History:  Family History  Problem Relation Age of Onset   Hypertension Mother    Arthritis Mother    Diabetes Mother    Diabetes Father    COPD Father    Breast cancer Neg Hx     Social History:  Social History   Socioeconomic History   Marital status: Married    Spouse name: Rosanna Randy   Number of children: 1   Years of education: Not on file   Highest education level: Bachelor's degree (e.g., BA, AB, BS)  Occupational History   Occupation: Fish farm manager  Tobacco Use   Smoking status: Never   Smokeless tobacco: Never  Vaping Use   Vaping Use: Never used  Substance and Sexual Activity   Alcohol use: Yes    Comment: occasional wine   Drug use: No   Sexual activity: Yes    Partners: Male    Birth control/protection: Surgical  Other Topics Concern   Not on file  Social History Narrative   Patient's daughter currently living in [place], [place]   Social Determinants of Health   Financial Resource Strain: Not on file  Food Insecurity: Not on file  Transportation Needs: Not on file  Physical Activity: Sufficiently Active (10/27/2018)   Exercise Vital Sign    Days of Exercise per Week: 3 days    Minutes  of Exercise per Session: 60 min  Stress: No Stress Concern Present (10/27/2018)   [place]    Feeling of Stress : Not at all  Social Connections: Moderately Integrated (10/27/2018)   Social Connection and Isolation Panel [NHANES]    Frequency of Communication with Friends and Family: More than three times a week    Frequency of Social Gatherings with Friends and Family: More than three times a week    Attends Religious Services: More than 4 times per year    Active Member of [employer] or Organizations: No    Attends Archivist Meetings: Never    Marital Status: Married  Human resources officer Violence: Not At Risk (10/27/2018)   Humiliation, Afraid, Rape, and Kick questionnaire    Fear of Current or Ex-Partner: No    Emotionally Abused: No    Physically Abused: No    Sexually Abused: No    Allergies:  Allergies  Allergen Reactions   Aspirin     Medications: Prior to Admission medications   Medication Sig Start Date End Date Taking? Authorizing Provider  Ascorbic Acid (VITAMIN C) 1000 MG tablet Take 1,000 mg by mouth daily.   Yes [provider]  calcium carbonate (TITRALAC) 420 MG CHEW chewable tablet Chew 420 mg by mouth.   Yes [provider]  Calcium-Vitamin D-Vitamin K (VIACTIV) [id number] MG-UNT-MCG CHEW Chew by mouth.   Yes [provider]  Collagen Hydrolysate, Bovine, POWD  09/28/21  Yes [provider]  finasteride (PROPECIA) 1 MG tablet Take 1 mg by mouth daily.   Yes [provider]  hydroxychloroquine (PLAQUENIL) 200 MG tablet Take by mouth daily.   Yes [provider]  Multiple Vitamin (MULTIVITAMIN) tablet Take 1 tablet by mouth daily.   Yes [provider]  NON FORMULARY Hair vitamin supplement Nutrafol 02/03/21  Yes [provider]  Omega-3 Fatty Acids (FISH OIL) 1000 MG CAPS Take by mouth.   Yes [provider]   Probiotic Product (TRUBIOTICS PO) Take by mouth.   Yes [provider]  fluocinolone (VANOS) 0.01 % cream Apply topically 2 (two) times daily. Patient not taking: Reported on 01/08/2023    [provider]  minoxidil (LONITEN) 2.5 MG tablet Take by mouth daily. Patient not taking: Reported on 01/08/2023    [provider]  ondansetron (ZOFRAN ODT) 4 MG disintegrating tablet Take 1 tablet (4 mg total) by mouth every 8 (eight) hours as needed for nausea or vomiting. Patient not taking: Reported on 01/08/2023 06/17/20   Marval Regal, NP    Physical Exam Vitals: Blood pressure 112/65, pulse 84, resp. rate 15, height '5\' 2"'$  (1.575 m), weight 133 lb 14.4 oz (60.7 kg).  General: NAD HEENT: normocephalic, anicteric Thyroid: no enlargement, no palpable nodules Pulmonary: No increased work of breathing, CTAB Cardiovascular:  RRR, distal pulses 2+ Breast: Breast symmetrical, no tenderness, no palpable nodules or masses, no skin or nipple retraction present, no nipple discharge.  No axillary or supraclavicular lymphadenopathy. Recent mammogram is RAds1 Abdomen: NABS, soft, non-tender, non-distended.  Umbilicus without lesions.  No hepatomegaly, splenomegaly or masses palpable. No evidence of hernia  Genitourinary:  External: Normal external female genitalia.  Normal urethral meatus, normal Bartholin's and Skene's glands.    Vagina: Normal vaginal mucosa, no evidence of prolapse.    Cervix: Grossly normal in appearance, no bleeding. Last pap done!  Hysterectomy. Adnexa: ovaries non-enlarged, no adnexal masses  Rectal: deferred  Lymphatic: no evidence of inguinal lymphadenopathy Extremities: no edema, erythema, or tenderness Neurologic: Grossly intact Psychiatric: mood appropriate, affect full  Female chaperone present for pelvic and breast  portions of the physical exam     Assessment: 67 y.o. G2P1011 routine annual exam Last pap smear! Up to date on mammogram and labs  per her PCP Hx of hysterectomy.  Plan: Problem List Items Addressed This Visit   None   1) Mammogram - recommend yearly screening mammogram.  Mammogram Is up to date  2) STI screening  was notoffered and therefore not obtained  3) ASCCP guidelines and rational discussed.  Patient opts for discontinue age >64 screening interval. We did her last pap smear today.  4) Osteoporosis  - per USPTF routine screening DEXA at age 73 This has been done per her PCP  Consider FDA-approved medical therapies in postmenopausal women and men aged 22 years and older, based on the following: a) A hip or vertebral (clinical or morphometric) fracture b) T-score ? -2.5 at the femoral neck or spine after appropriate evaluation to exclude secondary causes C) Low bone mass (T-score between -1.0 and -2.5 at the femoral neck or spine) and a 10-year probability of a hip fracture ? 3% or a 10-year probability of a major osteoporosis-related fracture ? 20% based on the [place]-adapted WHO algorithm   5) Routine healthcare maintenance including cholesterol, diabetes screening discussed managed by PCP  6) Colonoscopy last done 08/2020. Per her PCP.  Screening recommended starting at age 77 for average risk individuals, age 102 for individuals deemed at increased risk (including African Americans) and recommended to continue until age 1.  For patient age 35-85 individualized approach is recommended.  Gold standard screening is via colonoscopy, Cologuard screening is an acceptable alternative for patient unwilling or unable to undergo colonoscopy.  "Colorectal cancer screening for average?risk adults: 2018 guideline update from the American Cancer Society"[place]: A Cancer Journal for Clinicians: Apr 03, 2017   7) No follow-ups on file.   Imagene Riches, CNM  01/08/2023 8:42 AM   [facility], [facility] 01/08/2023, 8:42 AM

## 2023-01-09 ENCOUNTER — Encounter: Payer: Self-pay | Admitting: Obstetrics

## 2023-01-09 LAB — CYTOLOGY - PAP: Diagnosis: NEGATIVE

## 2023-01-29 ENCOUNTER — Encounter: Admitting: Family Medicine

## 2023-03-04 NOTE — Progress Notes (Deleted)
   SUBJECTIVE:  No chief complaint on file.  HPI ***  PERTINENT PMH / PSH: ***  OBJECTIVE:  There were no vitals taken for this visit.   Physical Exam  ASSESSMENT/PLAN:  There are no diagnoses linked to this encounter. PDMP reviewed***  No follow-ups on file.  Eartha Vonbehren, MD 

## 2023-03-05 ENCOUNTER — Encounter: Admitting: Family Medicine

## 2023-03-27 ENCOUNTER — Encounter: Payer: Self-pay | Admitting: Nurse Practitioner

## 2023-03-27 ENCOUNTER — Ambulatory Visit (INDEPENDENT_AMBULATORY_CARE_PROVIDER_SITE_OTHER): Payer: Medicare PPO | Admitting: Nurse Practitioner

## 2023-03-27 VITALS — BP 118/68 | HR 66 | Temp 98.9°F | Ht 62.0 in | Wt 137.8 lb

## 2023-03-27 DIAGNOSIS — Z1329 Encounter for screening for other suspected endocrine disorder: Secondary | ICD-10-CM

## 2023-03-27 DIAGNOSIS — E785 Hyperlipidemia, unspecified: Secondary | ICD-10-CM | POA: Diagnosis not present

## 2023-03-27 DIAGNOSIS — Z0000 Encounter for general adult medical examination without abnormal findings: Secondary | ICD-10-CM

## 2023-03-27 DIAGNOSIS — E559 Vitamin D deficiency, unspecified: Secondary | ICD-10-CM

## 2023-03-27 NOTE — Assessment & Plan Note (Signed)
Physical exam complete. Lab work as outlined. Patient will return to complete when fasting. Will contact her with the results. Pap no longer indicated. Colonoscopy- UTD. Mammogram- UTD. Flu vaccine not due. Tetanus vaccine- UTD. Declined additional COVID vaccines. Shingles vaccine and Pneumonia vaccine due- patient declined today. Encouraged patient to get them at local pharmacy or return to get here when she is ready. Information provided. HIV/Hep C screenings negative. Recommended follow ups with Dentist and Ophthalmology for annual exams. Encouraged to continue healthy diet and exercise. Return to care to complete fasting lab work, then one year after, sooner PRN.

## 2023-03-27 NOTE — Assessment & Plan Note (Signed)
Chronic. Taking Omega 3 Fatty Acids and monitoring diet. Will check fasting lipids. Encouraged to continue healthy diet and exercise.

## 2023-03-27 NOTE — Progress Notes (Signed)
Kaitlyn Dicker, NP-C Phone: [phone]  Kaitlyn Forbes is a 67 y.o. female who presents today for transfer of care and annual exam. She has no complaints or new concerns. She would like to return to complete lab work when fasting.   HYPERLIPIDEMIA Symptoms Chest pain on exertion:  No   Leg claudication:   No Medications: Compliance- Omega 3 Fatty Acids and diet control Right upper quadrant pain- No  Muscle aches- No Lipid Panel     Component Value Date/Time   CHOL 213 (H) 10/05/2021 0922   CHOL 229 (H) 06/22/2020 0845   TRIG 51.0 10/05/2021 0922   HDL 64.40 10/05/2021 0922   HDL 89 06/22/2020 0845   CHOLHDL 3 10/05/2021 0922   VLDL 10.2 10/05/2021 0922   LDLCALC 138 (H) 10/05/2021 0922   LDLCALC 132 (H) 06/22/2020 0845   LABVLDL 8 06/22/2020 0845   Diet: Well rounded- counts macros, increased protein, decreasing carbs and fats Exercise: Boot camp 2 days per week, works out at [employer] 5 days per week Pap smear: Hysterectomy Colonoscopy: 09/02/2020 - 10 year recall Mammogram: 12/13/2022 Family history-  Colon cancer: No  Breast cancer: No  Ovarian cancer: No Menses: Hysterectomy Sexually active: No Vaccines-   Flu: Not due  Tetanus: 07/05/2020  Shingles: Needs- Declined today  COVID19: x 4  Pneumonia: Needs- Declined today HIV screening: Negative Hep C Screening: Negative Tobacco use: No Alcohol use: No Illicit Drug use: No Dentist: Yes Ophthalmology: Yes   Social History   Tobacco Use  Smoking Status Never  Smokeless Tobacco Never    Current Outpatient Medications on File Prior to Visit  Medication Sig Dispense Refill   Ascorbic Acid (VITAMIN C) 1000 MG tablet Take 1,000 mg by mouth daily.     Calcium-Vitamin D-Vitamin K (VIACTIV) 500-500-40 MG-UNT-MCG CHEW Chew by mouth.     Collagen Hydrolysate, Bovine, POWD      finasteride (PROPECIA) 1 MG tablet Take 1 mg by mouth daily.     hydroxychloroquine (PLAQUENIL) 200 MG tablet Take by mouth daily.      minoxidil (LONITEN) 2.5 MG tablet Take by mouth daily.     Multiple Vitamin (MULTIVITAMIN) tablet Take 1 tablet by mouth daily.     NON FORMULARY Hair vitamin supplement Nutrafol     Omega-3 Fatty Acids (FISH OIL) 1000 MG CAPS Take by mouth.     Probiotic Product (TRUBIOTICS PO) Take by mouth.     No current facility-administered medications on file prior to visit.    ROS see history of present illness  Objective  Physical Exam Vitals:   03/27/23 1302  BP: 118/68  Pulse: 66  Temp: 98.9 F (37.2 C)  SpO2: 98%    BP Readings from Last 3 Encounters:  03/27/23 118/68  01/08/23 112/65  12/25/21 122/74   Wt Readings from Last 3 Encounters:  03/27/23 137 lb 12.8 oz (62.5 kg)  01/08/23 133 lb 14.4 oz (60.7 kg)  12/25/21 141 lb (64 kg)    Physical Exam Constitutional:      General: She is not in acute distress.    Appearance: Normal appearance.  HENT:     Head: Normocephalic.     Right Ear: Tympanic membrane normal.     Left Ear: Tympanic membrane normal.     Nose: Nose normal.     Mouth/Throat:     Mouth: Mucous membranes are moist.     Pharynx: Oropharynx is clear.  Eyes:     Conjunctiva/sclera: Conjunctivae normal.  Pupils: Pupils are equal, round, and reactive to light.  Neck:     Thyroid: No thyromegaly.  Cardiovascular:     Rate and Rhythm: Normal rate and regular rhythm.     Heart sounds: Normal heart sounds.  Pulmonary:     Effort: Pulmonary effort is normal.     Breath sounds: Normal breath sounds.  Abdominal:     General: Abdomen is flat. Bowel sounds are normal.     Palpations: Abdomen is soft. There is no mass.     Tenderness: There is no abdominal tenderness.  Musculoskeletal:        General: Normal range of motion.  Lymphadenopathy:     Cervical: No cervical adenopathy.  Skin:    General: Skin is warm and dry.     Findings: No rash.  Neurological:     General: No focal deficit present.     Mental Status: She is alert.  Psychiatric:         Mood and Affect: Mood normal.        Behavior: Behavior normal.    Assessment/Plan: Please see individual problem list.  Preventative health care Assessment & Plan: Physical exam complete. Lab work as outlined. Patient will return to complete when fasting. Will contact her with the results. Pap no longer indicated. Colonoscopy- UTD. Mammogram- UTD. Flu vaccine not due. Tetanus vaccine- UTD. Declined additional COVID vaccines. Shingles vaccine and Pneumonia vaccine due- patient declined today. Encouraged patient to get them at local pharmacy or return to get here when she is ready. Information provided. HIV/Hep C screenings negative. Recommended follow ups with Dentist and Ophthalmology for annual exams. Encouraged to continue healthy diet and exercise. Return to care to complete fasting lab work, then one year after, sooner PRN.   Orders: -     CBC with Differential/Platelet; Future -     Comprehensive metabolic panel; Future -     Vitamin B12; Future  Vitamin D deficiency Assessment & Plan: Chronic. Taking OTC daily supplement. Continue. Will check vitamin D level.   Orders: -     VITAMIN D 25 Hydroxy (Vit-D Deficiency, Fractures); Future  Hyperlipidemia, unspecified hyperlipidemia type Assessment & Plan: Chronic. Taking Omega 3 Fatty Acids and monitoring diet. Will check fasting lipids. Encouraged to continue healthy diet and exercise.   Orders: -     Lipid panel; Future  Thyroid disorder screen -     TSH; Future   Return in about 1 year (around 03/26/2024) for Annual Exam, sooner PRN.   Kaitlyn Dicker, NP-C [facility] - [employer]

## 2023-03-27 NOTE — Assessment & Plan Note (Signed)
Chronic. Taking OTC daily supplement. Continue. Will check vitamin D level.  

## 2023-05-01 ENCOUNTER — Other Ambulatory Visit (INDEPENDENT_AMBULATORY_CARE_PROVIDER_SITE_OTHER): Payer: Medicare PPO

## 2023-05-01 DIAGNOSIS — E785 Hyperlipidemia, unspecified: Secondary | ICD-10-CM | POA: Diagnosis not present

## 2023-05-01 DIAGNOSIS — Z1329 Encounter for screening for other suspected endocrine disorder: Secondary | ICD-10-CM | POA: Diagnosis not present

## 2023-05-01 DIAGNOSIS — E559 Vitamin D deficiency, unspecified: Secondary | ICD-10-CM

## 2023-05-01 DIAGNOSIS — Z0000 Encounter for general adult medical examination without abnormal findings: Secondary | ICD-10-CM

## 2023-05-01 LAB — CBC WITH DIFFERENTIAL/PLATELET
Basophils Absolute: 0.1 10*3/uL (ref 0.0–0.1)
Basophils Relative: 1 % (ref 0.0–3.0)
Eosinophils Absolute: 0.1 10*3/uL (ref 0.0–0.7)
Eosinophils Relative: 2 % (ref 0.0–5.0)
HCT: 43.2 % (ref 36.0–46.0)
Hemoglobin: 14.4 g/dL (ref 12.0–15.0)
Lymphocytes Relative: 43.6 % (ref 12.0–46.0)
Lymphs Abs: 2.2 10*3/uL (ref 0.7–4.0)
MCHC: 33.4 g/dL (ref 30.0–36.0)
MCV: 82.1 fl (ref 78.0–100.0)
Monocytes Absolute: 0.5 10*3/uL (ref 0.1–1.0)
Monocytes Relative: 10.8 % (ref 3.0–12.0)
Neutro Abs: 2.2 10*3/uL (ref 1.4–7.7)
Neutrophils Relative %: 42.6 % — ABNORMAL LOW (ref 43.0–77.0)
Platelets: 217 10*3/uL (ref 150.0–400.0)
RBC: 5.26 Mil/uL — ABNORMAL HIGH (ref 3.87–5.11)
RDW: 13.9 % (ref 11.5–15.5)
WBC: 5 10*3/uL (ref 4.0–10.5)

## 2023-05-01 LAB — COMPREHENSIVE METABOLIC PANEL
ALT: 15 U/L (ref 0–35)
AST: 25 U/L (ref 0–37)
Albumin: 4 g/dL (ref 3.5–5.2)
Alkaline Phosphatase: 46 U/L (ref 39–117)
BUN: 23 mg/dL (ref 6–23)
CO2: 30 mEq/L (ref 19–32)
Calcium: 9.3 mg/dL (ref 8.4–10.5)
Chloride: 98 mEq/L (ref 96–112)
Creatinine, Ser: 1.04 mg/dL (ref 0.40–1.20)
GFR: 56.16 mL/min — ABNORMAL LOW (ref >60.00)
Glucose, Bld: 79 mg/dL (ref 70–99)
Potassium: 4 mEq/L (ref 3.5–5.1)
Sodium: 135 mEq/L (ref 135–145)
Total Bilirubin: 0.6 mg/dL (ref 0.2–1.2)
Total Protein: 6.9 g/dL (ref 6.0–8.3)

## 2023-05-01 LAB — VITAMIN D 25 HYDROXY (VIT D DEFICIENCY, FRACTURES): VITD: 48.04 ng/mL (ref 30.00–100.00)

## 2023-05-01 LAB — LIPID PANEL
Cholesterol: 190 mg/dL (ref 0–200)
HDL: 70.2 mg/dL (ref >39.00)
LDL Cholesterol: 109 mg/dL — ABNORMAL HIGH (ref 0–99)
NonHDL: 119.65
Total CHOL/HDL Ratio: 3
Triglycerides: 51 mg/dL (ref 0.0–149.0)
VLDL: 10.2 mg/dL (ref 0.0–40.0)

## 2023-05-01 LAB — VITAMIN B12: Vitamin B-12: 789 pg/mL (ref 211–911)

## 2023-05-01 LAB — TSH: TSH: 0.86 u[IU]/mL (ref 0.35–5.50)

## 2023-05-21 ENCOUNTER — Ambulatory Visit: Payer: Medicare PPO

## 2023-05-28 ENCOUNTER — Ambulatory Visit (INDEPENDENT_AMBULATORY_CARE_PROVIDER_SITE_OTHER): Payer: Medicare PPO | Admitting: *Deleted

## 2023-05-28 VITALS — Ht 62.0 in | Wt 134.1 lb

## 2023-05-28 DIAGNOSIS — Z0000 Encounter for general adult medical examination without abnormal findings: Secondary | ICD-10-CM | POA: Diagnosis not present

## 2023-05-28 NOTE — Patient Instructions (Signed)
Patient was unable to self-report vital signs via telehealth due to a lack of equipment at home.  Kaitlyn Forbes , Thank you for taking time to come for your Medicare Wellness Visit. I appreciate your ongoing commitment to your health goals. Please review the following plan we discussed and let me know if I can assist you in the future.   These are the goals we discussed:  Goals      Patient Stated     Wants to get stronger         This is a list of the screening recommended for you and due dates:  Health Maintenance  Topic Date Due   Zoster (Shingles) Vaccine (1 of 2) Never done   Pneumonia Vaccine (1 of 1 - PCV) Never done   COVID-19 Vaccine (5 - 2023-24 season) 07/06/2022   Flu Shot  06/06/2023   Mammogram  12/14/2023   Medicare Annual Wellness Visit  05/27/2024   DTaP/Tdap/Td vaccine (2 - Td or Tdap) 07/05/2030   Colon Cancer Screening  09/02/2030   DEXA scan (bone density measurement)  Completed   Hepatitis C Screening  Completed   HPV Vaccine  Aged Out    Advanced directives:Will work on this.  Conditions/risks identified: None  Next appointment: Follow up in one year for your annual wellness visit 05/29/24 @ 10:30   Preventive Care 65 Years and Older, Female Preventive care refers to lifestyle choices and visits with your health care provider that can promote health and wellness. What does preventive care include? A yearly physical exam. This is also called an annual well check. Dental exams once or twice a year. Routine eye exams. Ask your health care provider how often you should have your eyes checked. Personal lifestyle choices, including: Daily care of your teeth and gums. Regular physical activity. Eating a healthy diet. Avoiding tobacco and drug use. Limiting alcohol use. Practicing safe sex. Taking low-dose aspirin every day. Taking vitamin and mineral supplements as recommended by your health care provider. What happens during an annual well  check? The services and screenings done by your health care provider during your annual well check will depend on your age, overall health, lifestyle risk factors, and family history of disease. Counseling  Your health care provider may ask you questions about your: Alcohol use. Tobacco use. Drug use. Emotional well-being. Home and relationship well-being. Sexual activity. Eating habits. History of falls. Memory and ability to understand (cognition). Work and work Astronomer. Reproductive health. Screening  You may have the following tests or measurements: Height, weight, and BMI. Blood pressure. Lipid and cholesterol levels. These may be checked every 5 years, or more frequently if you are over 68 years old. Skin check. Lung cancer screening. You may have this screening every year starting at age 57 if you have a 30-pack-year history of smoking and currently smoke or have quit within the past 15 years. Fecal occult blood test (FOBT) of the stool. You may have this test every year starting at age 42. Flexible sigmoidoscopy or colonoscopy. You may have a sigmoidoscopy every 5 years or a colonoscopy every 10 years starting at age 41. Hepatitis C blood test. Hepatitis B blood test. Sexually transmitted disease (STD) testing. Diabetes screening. This is done by checking your blood sugar (glucose) after you have not eaten for a while (fasting). You may have this done every 1-3 years. Bone density scan. This is done to screen for osteoporosis. You may have this done starting at age 16. Mammogram.  This may be done every 1-2 years. Talk to your health care provider about how often you should have regular mammograms. Talk with your health care provider about your test results, treatment options, and if necessary, the need for more tests. Vaccines  Your health care provider may recommend certain vaccines, such as: Influenza vaccine. This is recommended every year. Tetanus, diphtheria, and  acellular pertussis (Tdap, Td) vaccine. You may need a Td booster every 10 years. Zoster vaccine. You may need this after age 62. Pneumococcal 13-valent conjugate (PCV13) vaccine. One dose is recommended after age 31. Pneumococcal polysaccharide (PPSV23) vaccine. One dose is recommended after age 1. Talk to your health care provider about which screenings and vaccines you need and how often you need them. This information is not intended to replace advice given to you by your health care provider. Make sure you discuss any questions you have with your health care provider. Document Released: 11/18/2015 Document Revised: 07/11/2016 Document Reviewed: 08/23/2015 Elsevier Interactive Patient Education  2017 [employer].  Fall Prevention in the Home Falls can cause injuries. They can happen to people of all ages. There are many things you can do to make your home safe and to help prevent falls. What can I do on the outside of my home? Regularly fix the edges of walkways and driveways and fix any cracks. Remove anything that might make you trip as you walk through a door, such as a raised step or threshold. Trim any bushes or trees on the path to your home. Use bright outdoor lighting. Clear any walking paths of anything that might make someone trip, such as rocks or tools. Regularly check to see if handrails are loose or broken. Make sure that both sides of any steps have handrails. Any raised decks and porches should have guardrails on the edges. Have any leaves, snow, or ice cleared regularly. Use sand or salt on walking paths during winter. Clean up any spills in your garage right away. This includes oil or grease spills. What can I do in the bathroom? Use night lights. Install grab bars by the toilet and in the tub and shower. Do not use towel bars as grab bars. Use non-skid mats or decals in the tub or shower. If you need to sit down in the shower, use a plastic, non-slip stool. Keep  the floor dry. Clean up any water that spills on the floor as soon as it happens. Remove soap buildup in the tub or shower regularly. Attach bath mats securely with double-sided non-slip rug tape. Do not have throw rugs and other things on the floor that can make you trip. What can I do in the bedroom? Use night lights. Make sure that you have a light by your bed that is easy to reach. Do not use any sheets or blankets that are too big for your bed. They should not hang down onto the floor. Have a firm chair that has side arms. You can use this for support while you get dressed. Do not have throw rugs and other things on the floor that can make you trip. What can I do in the kitchen? Clean up any spills right away. Avoid walking on wet floors. Keep items that you use a lot in easy-to-reach places. If you need to reach something above you, use a strong step stool that has a grab bar. Keep electrical cords out of the way. Do not use floor polish or wax that makes floors slippery. If you  must use wax, use non-skid floor wax. Do not have throw rugs and other things on the floor that can make you trip. What can I do with my stairs? Do not leave any items on the stairs. Make sure that there are handrails on both sides of the stairs and use them. Fix handrails that are broken or loose. Make sure that handrails are as long as the stairways. Check any carpeting to make sure that it is firmly attached to the stairs. Fix any carpet that is loose or worn. Avoid having throw rugs at the top or bottom of the stairs. If you do have throw rugs, attach them to the floor with carpet tape. Make sure that you have a light switch at the top of the stairs and the bottom of the stairs. If you do not have them, ask someone to add them for you. What else can I do to help prevent falls? Wear shoes that: Do not have high heels. Have rubber bottoms. Are comfortable and fit you well. Are closed at the toe. Do not  wear sandals. If you use a stepladder: Make sure that it is fully opened. Do not climb a closed stepladder. Make sure that both sides of the stepladder are locked into place. Ask someone to hold it for you, if possible. Clearly mark and make sure that you can see: Any grab bars or handrails. First and last steps. Where the edge of each step is. Use tools that help you move around (mobility aids) if they are needed. These include: Canes. Walkers. Scooters. Crutches. Turn on the lights when you go into a dark area. Replace any light bulbs as soon as they burn out. Set up your furniture so you have a clear path. Avoid moving your furniture around. If any of your floors are uneven, fix them. If there are any pets around you, be aware of where they are. Review your medicines with your doctor. Some medicines can make you feel dizzy. This can increase your chance of falling. Ask your doctor what other things that you can do to help prevent falls. This information is not intended to replace advice given to you by your health care provider. Make sure you discuss any questions you have with your health care provider. Document Released: 08/18/2009 Document Revised: 03/29/2016 Document Reviewed: 11/26/2014 Elsevier Interactive Patient Education  2017 [employer].

## 2023-05-28 NOTE — Progress Notes (Signed)
Subjective:   Kaitlyn Forbes is a 67 y.o. female who presents for an Initial Medicare Annual Wellness Visit.  Visit Complete: Virtual  I connected with  [place] on 05/28/23 by a audio enabled telemedicine application and verified that I am speaking with the correct person using two identifiers.  Patient Location: Home  Provider Location: Office/Clinic  I discussed the limitations of evaluation and management by telemedicine. The patient expressed understanding and agreed to proceed.  Review of Systems     Cardiac Risk Factors include: advanced age (>72men, >55 women);dyslipidemia     Objective:    Today's Vitals   05/28/23 1106  Weight: 134 lb 2 oz (60.8 kg)  Height: 5\' 2"  (1.575 m)   Body mass index is 24.53 kg/m.     05/28/2023   11:16 AM 09/02/2020    9:37 AM  Advanced Directives  Does Patient Have a Medical Advance Directive? No No  Would patient like information on creating a medical advance directive?  No - Patient declined    Current Medications (verified) Outpatient Encounter Medications as of 05/28/2023  Medication Sig   Ascorbic Acid (VITAMIN C) 1000 MG tablet Take 1,000 mg by mouth daily.   Calcium-Vitamin D-Vitamin K (VIACTIV) 500-500-40 MG-UNT-MCG CHEW Chew by mouth.   Collagen Hydrolysate, Bovine, POWD    finasteride (PROPECIA) 1 MG tablet Take 1 mg by mouth daily.   hydroxychloroquine (PLAQUENIL) 200 MG tablet Take by mouth daily.   minoxidil (LONITEN) 2.5 MG tablet Take by mouth daily.   Multiple Vitamin (MULTIVITAMIN) tablet Take 1 tablet by mouth daily.   NON FORMULARY Hair vitamin supplement Nutrafol   Omega-3 Fatty Acids (FISH OIL) 1000 MG CAPS Take by mouth.   Probiotic Product (TRUBIOTICS PO) Take by mouth.   No facility-administered encounter medications on file as of 05/28/2023.    Allergies (verified) Aspirin   History: Past Medical History:  Diagnosis Date   Diverticulosis    Dysuria 10/05/2021   Fibroids    Migraines     Osteoarthritis    right knee   Screening for HIV (human immunodeficiency virus) 06/15/2020   Past Surgical History:  Procedure Laterality Date   ABDOMINAL HYSTERECTOMY  02/1997   TAH for fibroids/ Dr Harold Hedge   COLONOSCOPY  09/01/2010   diverticulosis   COLONOSCOPY WITH PROPOFOL N/A 09/02/2020   Procedure: COLONOSCOPY WITH PROPOFOL;  Surgeon: Wyline Mood, MD;  Location[facility] ENDOSCOPY;  Service: Gastroenterology;  Laterality: N/A;   ECTOPIC PREGNANCY SURGERY Left 11/1990   with left salpingectomy   ESOPHAGOGASTRODUODENOSCOPY  09/01/2010   Dr Ricki Rodriguez. Prior EGD for anemia work up by Dr Maryruth Bun.   HYSTEROSALPINGOGRAM  01/1991   distal obstruction of right Fallopian tube   INCISION AND DRAINAGE     left bartholin cyst/ Word catheter   Family History  Problem Relation Age of Onset   Hypertension Mother    Arthritis Mother    Diabetes Mother    Diabetes Father    COPD Father    Breast cancer Neg Hx    Social History   Socioeconomic History   Marital status: Married    Spouse name: Sullivan Lone   Number of children: 1   Years of education: Not on file   Highest education level: Bachelor's degree (e.g., BA, AB, BS)  Occupational History   Occupation: Runner, broadcasting/film/video  Tobacco Use   Smoking status: Never   Smokeless tobacco: Never  Vaping Use   Vaping status: Never Used  Substance and Sexual Activity  Alcohol use: Yes    Comment: occasional wine   Drug use: No   Sexual activity: Yes    Partners: Male    Birth control/protection: Surgical  Other Topics Concern   Not on file  Social History Narrative   Patient's daughter currently living in [place], [place]   Social Determinants of Health   Financial Resource Strain: Low Risk  (05/28/2023)   Overall Financial Resource Strain (CARDIA)    Difficulty of Paying Living Expenses: Not hard at all  Food Insecurity: No Food Insecurity (05/28/2023)   Hunger Vital Sign    Worried About Running Out of Food in the Last Year: Never  true    Ran Out of Food in the Last Year: Never true  Transportation Needs: No Transportation Needs (05/28/2023)   PRAPARE - Administrator, Civil Service (Medical): No    Lack of Transportation (Non-Medical): No  Physical Activity: Sufficiently Active (05/28/2023)   Exercise Vital Sign    Days of Exercise per Week: 5 days    Minutes of Exercise per Session: 90 min  Stress: No Stress Concern Present (05/28/2023)   [employer] of Occupational Health - Occupational Stress Questionnaire    Feeling of Stress : Not at all  Social Connections: Socially Integrated (05/28/2023)   Social Connection and Isolation Panel [NHANES]    Frequency of Communication with Friends and Family: More than three times a week    Frequency of Social Gatherings with Friends and Family: More than three times a week    Attends Religious Services: More than 4 times per year    Active Member of [employer] or Organizations: Yes    Attends Engineer, structural: More than 4 times per year    Marital Status: Married    Tobacco Counseling Counseling given: Not Answered   Clinical Intake:  Pre-visit preparation completed: Yes  Pain : No/denies pain     BMI - recorded: 24.53 Nutritional Status: BMI of 19-24  Normal Nutritional Risks: None Diabetes: No  How often do you need to have someone help you when you read instructions, pamphlets, or other written materials from your doctor or pharmacy?: 1 - Never  Interpreter Needed?: No  Information entered by :: R. Knox Cervi LPN   Activities of Daily Living    05/28/2023   11:08 AM 05/20/2023   11:04 AM  In your present state of health, do you have any difficulty performing the following activities:  Hearing? 0 0  Vision? 0 0  Comment contacts and readers   Difficulty concentrating or making decisions? 0 0  Walking or climbing stairs? 0 0  Dressing or bathing? 0 0  Doing errands, shopping? 0 0  Preparing Food and eating ? N N  Using the  Toilet? N N  In the past six months, have you accidently leaked urine? N N  Do you have problems with loss of bowel control? N N  Managing your Medications? N N  Managing your Finances? N N  Housekeeping or managing your Housekeeping? N N    Patient Care Team: Bethanie Dicker, NP as PCP - General (Nurse Practitioner)  Indicate any recent Medical Services you may have received from other than [facility] providers in the past year (date may be approximate).     Assessment:   This is a routine wellness examination for [place].  Hearing/Vision screen Hearing Screening - Comments:: No issues Vision Screening - Comments:: Readers and contacts  Dietary issues and exercise activities discussed:  Goals Addressed             This Visit's Progress    Patient Stated       Wants to get stronger        Depression Screen    05/28/2023   11:12 AM 03/27/2023    1:03 PM 10/05/2021    9:07 AM 06/15/2020   12:15 PM  PHQ 2/9 Scores  PHQ - 2 Score 0 0 0 0  PHQ- 9 Score 0 0  0    Fall Risk    05/28/2023   11:10 AM 05/20/2023   11:04 AM 03/27/2023    1:03 PM 06/15/2020   11:31 AM  Fall Risk   Falls in the past year? 0 0 0 0  Number falls in past yr: 0 0 0   Injury with Fall? 0 0 0   Risk for fall due to : No Fall Risks  No Fall Risks   Follow up Falls prevention discussed;Falls evaluation completed  Falls evaluation completed Falls evaluation completed    MEDICARE RISK AT HOME:  Medicare Risk at Home - 05/28/23 1110     Any stairs in or around the home? Yes    If so, are there any without handrails? No    Home free of loose throw rugs in walkways, pet beds, electrical cords, etc? Yes    Adequate lighting in your home to reduce risk of falls? Yes    Life alert? No    Use of a cane, walker or w/c? No    Grab bars in the bathroom? Yes    Shower chair or bench in shower? Yes    Elevated toilet seat or a handicapped toilet? No              Cognitive Function:         05/28/2023   11:17 AM  6CIT Screen  What Year? 0 points  What month? 0 points  What time? 0 points  Count back from 20 0 points  Months in reverse 0 points  Repeat phrase 2 points  Total Score 2 points    Immunizations Immunization History  Administered Date(s) Administered   Moderna Sars-Covid-2 Vaccination 10/31/2020, 05/16/2021   PFIZER(Purple Top)SARS-COV-2 Vaccination 01/18/2020, 02/15/2020   Tdap 07/05/2020    TDAP status: Up to date  Flu Vaccine status: Declined, Education has been provided regarding the importance of this vaccine but patient still declined. Advised may receive this vaccine at local pharmacy or Health Dept. Aware to provide a copy of the vaccination record if obtained from local pharmacy or Health Dept. Verbalized acceptance and understanding.  Pneumococcal vaccine status: Declined,  Education has been provided regarding the importance of this vaccine but patient still declined. Advised may receive this vaccine at local pharmacy or Health Dept. Aware to provide a copy of the vaccination record if obtained from local pharmacy or Health Dept. Verbalized acceptance and understanding.   Covid-19 vaccine status: Completed vaccines  Qualifies for Shingles Vaccine? Yes   Zostavax completed No   Shingrix Completed?: No.    Education has been provided regarding the importance of this vaccine. Patient has been advised to call insurance company to determine out of pocket expense if they have not yet received this vaccine. Advised may also receive vaccine at local pharmacy or Health Dept. Verbalized acceptance and understanding.  Screening Tests Health Maintenance  Topic Date Due   Medicare Annual Wellness (AWV)  Never done   Zoster Vaccines- Shingrix (1 of  2) Never done   Pneumonia Vaccine 34+ Years old (1 of 1 - PCV) Never done   COVID-19 Vaccine (5 - 2023-24 season) 07/06/2022   INFLUENZA VACCINE  06/06/2023   MAMMOGRAM  12/13/2024   DTaP/Tdap/Td (2 - Td or  Tdap) 07/05/2030   Colonoscopy  09/02/2030   DEXA SCAN  Completed   Hepatitis C Screening  Completed   HPV VACCINES  Aged Out    Health Maintenance  Health Maintenance Due  Topic Date Due   Medicare Annual Wellness (AWV)  Never done   Zoster Vaccines- Shingrix (1 of 2) Never done   Pneumonia Vaccine 40+ Years old (1 of 1 - PCV) Never done   COVID-19 Vaccine (5 - 2023-24 season) 07/06/2022    Colorectal cancer screening: Type of screening: Colonoscopy. Completed 10/21. Repeat every 10 years  Mammogram status: Completed 2/24. Repeat every year  Bone Density status: Completed 2. Results reflect: Bone density results: NORMAL. Repeat every 2 years. Declined at this time, wants to wait until mammogram is due next year  Lung Cancer Screening: (Low Dose CT Chest recommended if Age 17-80 years, 20 pack-year currently smoking OR have quit w/in 15years.) does not qualify.     Additional Screening:  Hepatitis C Screening: does qualify; Completed 8/21  Vision Screening: Recommended annual ophthalmology exams for early detection of glaucoma and other disorders of the eye. Is the patient up to date with their annual eye exam?  Yes  Who is the provider or what is the name of the office in which the patient attends annual eye exams? [facility] If pt is not established with a provider, would they like to be referred to a provider to establish care? No .   Dental Screening: Recommended annual dental exams for proper oral hygiene    Community Resource Referral / Chronic Care Management: CRR required this visit?  No   CCM required this visit?  No     Plan:     I have personally reviewed and noted the following in the patient's chart:   Medical and social history Use of alcohol, tobacco or illicit drugs  Current medications and supplements including opioid prescriptions. Patient is not currently taking opioid prescriptions. Functional ability and status Nutritional  status Physical activity Advanced directives List of other physicians Hospitalizations, surgeries, and ER visits in previous 12 months Vitals Screenings to include cognitive, depression, and falls Referrals and appointments  In addition, I have reviewed and discussed with patient certain preventive protocols, quality metrics, and best practice recommendations. A written personalized care plan for preventive services as well as general preventive health recommendations were provided to patient.     Sydell Axon, LPN   11/23/1476   After Visit Summary: (MyChart) Due to this being a telephonic visit, the after visit summary with patients personalized plan was offered to patient via MyChart   Nurse Notes: None

## 2023-11-07 ENCOUNTER — Other Ambulatory Visit: Payer: Self-pay | Admitting: Obstetrics

## 2023-11-07 DIAGNOSIS — Z1231 Encounter for screening mammogram for malignant neoplasm of breast: Secondary | ICD-10-CM

## 2023-12-16 ENCOUNTER — Ambulatory Visit
Admission: RE | Admit: 2023-12-16 | Discharge: 2023-12-16 | Disposition: A | Discharge status: Home / Self Care | Payer: Medicare PPO | Source: Ambulatory Visit | Attending: Obstetrics | Admitting: Obstetrics

## 2023-12-16 DIAGNOSIS — Z1231 Encounter for screening mammogram for malignant neoplasm of breast: Secondary | ICD-10-CM

## 2024-04-02 ENCOUNTER — Encounter: Payer: Medicare PPO | Admitting: Nurse Practitioner

## 2024-04-09 ENCOUNTER — Encounter: Admitting: Nurse Practitioner

## 2024-05-29 ENCOUNTER — Ambulatory Visit: Payer: Medicare PPO | Admitting: *Deleted

## 2024-05-29 VITALS — Ht 62.0 in | Wt 140.0 lb

## 2024-05-29 DIAGNOSIS — Z0000 Encounter for general adult medical examination without abnormal findings: Secondary | ICD-10-CM | POA: Diagnosis not present

## 2024-05-29 DIAGNOSIS — Z780 Asymptomatic menopausal state: Secondary | ICD-10-CM | POA: Diagnosis not present

## 2024-05-29 NOTE — Patient Instructions (Signed)
 Kaitlyn Forbes , Thank you for taking time out of your busy schedule to complete your Annual Wellness Visit with me. I enjoyed our conversation and look forward to speaking with you again next year. I, as well as your care team,  appreciate your ongoing commitment to your health goals. Please review the following plan we discussed and let me know if I can assist you in the future. Your Game plan/ To Do List    Referrals: If you haven't heard from the office you've been referred to, please reach out to them at the phone provided.   Order has been placed for your Dexa/Bone Density. Consider updating your pneumonia and shingles vaccines You have an order for:  []   2D Mammogram  []   3D Mammogram  [x]   Bone Density     Please call for appointment:  [facility]  [address] [place] [address] [phone]      Make sure to wear two-piece clothing.  No lotions, powders, or deodorants the day of the appointment. Make sure to bring picture ID and insurance card.  Bring list of medications you are currently taking including any supplements.   Follow up Visits: Next Medicare AWV with our clinical staff: 05/31/25 @ 11:30   Have you seen your provider in the last 6 months (3 months if uncontrolled diabetes)? Yes Next Office Visit with your provider: 07/21/24  Clinician Recommendations:  Aim for 30 minutes of exercise or brisk walking, 6-8 glasses of water, and 5 servings of fruits and vegetables each day.       This is a list of the screening recommended for you and due dates:  Health Maintenance  Topic Date Due   Pneumococcal Vaccine for age over 65 (1 of 1 - PCV) Never done   Zoster (Shingles) Vaccine (1 of 2) Never done   COVID-19 Vaccine (5 - 2024-25 season) 07/07/2023   Flu Shot  06/05/2024   Mammogram  12/15/2024   Medicare Annual Wellness Visit  05/29/2025   DTaP/Tdap/Td vaccine (2 - Td or Tdap) 07/05/2030   Colon Cancer  Screening  09/02/2030   DEXA scan (bone density measurement)  Completed   Hepatitis C Screening  Completed   Hepatitis B Vaccine  Aged Out   HPV Vaccine  Aged Out   Meningitis B Vaccine  Aged Out    Advanced directives: (ACP Link)Information on Advanced Care Planning can be found at [place]  Best boy Advance Health Care Directives Advance Health Care Directives. [email or web address]  Advance Care Planning is important because it:  [x]  Makes sure you receive the medical care that is consistent with your values, goals, and preferences  [x]  It provides guidance to your family and loved ones and reduces their decisional burden about whether or not they are making the right decisions based on your wishes.

## 2024-05-29 NOTE — Progress Notes (Signed)
 Subjective:   Kaitlyn Forbes is a 68 y.o. who presents for a Medicare Wellness preventive visit.  As a reminder, Annual Wellness Visits don't include a physical exam, and some assessments may be limited, especially if this visit is performed virtually. We may recommend an in-person follow-up visit with your provider if needed.  Visit Complete: Virtual I connected with  Rock Kaitlyn Forbes on 05/29/24 by a audio enabled telemedicine application and verified that I am speaking with the correct person using two identifiers.  Patient Location: Home  Provider Location: Home Office  I discussed the limitations of evaluation and management by telemedicine. The patient expressed understanding and agreed to proceed.  Vital Signs: Because this visit was a virtual/telehealth visit, some criteria may be missing or patient reported. Any vitals not documented were not able to be obtained and vitals that have been documented are patient reported.  VideoDeclined- This patient declined Librarian, academic. Therefore the visit was completed with audio only.  Persons Participating in Visit: Patient.  AWV Questionnaire: Yes: Patient Medicare AWV questionnaire was completed by the patient on 05/25/24; I have confirmed that all information answered by patient is correct and no changes since this date.  Cardiac Risk Factors include: advanced age (>1men, >1 women);dyslipidemia     Objective:    Today's Vitals   05/29/24 1059  Weight: 140 lb (63.5 kg)  Height: 5' 2 (1.575 m)   Body mass index is 25.61 kg/m.     05/29/2024   11:08 AM 05/28/2023   11:16 AM 09/02/2020    9:37 AM  Advanced Directives  Does Patient Have a Medical Advance Directive? No No No  Would patient like information on creating a medical advance directive? No - Patient declined  No - Patient declined    Current Medications (verified) Outpatient Encounter Medications as of 05/29/2024  Medication Sig    Ascorbic Acid (VITAMIN C) 1000 MG tablet Take 1,000 mg by mouth daily.   Calcium-Vitamin D -Vitamin K (VIACTIV) 500-500-40 MG-UNT-MCG CHEW Chew by mouth.   Collagen Hydrolysate, Bovine, POWD    finasteride (PROPECIA) 1 MG tablet Take 1 mg by mouth daily.   hydroxychloroquine (PLAQUENIL) 200 MG tablet Take by mouth daily.   minoxidil (LONITEN) 2.5 MG tablet Take by mouth daily.   Multiple Vitamin (MULTIVITAMIN) tablet Take 1 tablet by mouth daily.   NON FORMULARY Hair vitamin supplement Nutrafol   Omega-3 Fatty Acids (FISH OIL) 1000 MG CAPS Take by mouth.   Probiotic Product (TRUBIOTICS PO) Take by mouth.   No facility-administered encounter medications on file as of 05/29/2024.    Allergies (verified) Aspirin   History: Past Medical History:  Diagnosis Date   Diverticulosis    Dysuria 10/05/2021   Fibroids    Migraines    Osteoarthritis    right knee   Screening for HIV (human immunodeficiency virus) 06/15/2020   Past Surgical History:  Procedure Laterality Date   ABDOMINAL HYSTERECTOMY  02/1997   TAH for fibroids/ Kaitlyn Forbes   COLONOSCOPY  09/01/2010   diverticulosis   COLONOSCOPY WITH PROPOFOL  N/A 09/02/2020   Procedure: COLONOSCOPY WITH PROPOFOL ;  Surgeon: Kaitlyn Bi, MD;  Location: [facility] ENDOSCOPY;  Service: Gastroenterology;  Laterality: N/A;   ECTOPIC PREGNANCY SURGERY Left 11/1990   with left salpingectomy   ESOPHAGOGASTRODUODENOSCOPY  09/01/2010   Kaitlyn Kaitlyn Forbes. Prior EGD for anemia work up by Kaitlyn Kaitlyn Forbes.   HYSTEROSALPINGOGRAM  01/1991   distal obstruction of right Fallopian tube   INCISION AND DRAINAGE  left bartholin cyst/ Word catheter   Family History  Problem Relation Age of Onset   Hypertension Mother    Arthritis Mother    Diabetes Mother    Diabetes Father    COPD Father    Breast cancer Neg Hx    Social History   Socioeconomic History   Marital status: Married    Spouse name: Bertrum   Number of children: 1   Years of education: Not on file    Highest education level: Some college, no degree  Occupational History   Occupation: Runner, broadcasting/film/video  Tobacco Use   Smoking status: Never   Smokeless tobacco: Never  Vaping Use   Vaping status: Never Used  Substance and Sexual Activity   Alcohol use: Yes    Comment: occasional wine   Drug use: No   Sexual activity: Yes    Partners: Male    Birth control/protection: Surgical  Other Topics Concern   Not on file  Social History Narrative   Patient's daughter currently living in [place] , [place]   Social Drivers of Health   Financial Resource Strain: Low Risk  (05/25/2024)   Overall Financial Resource Strain (CARDIA)    Difficulty of Paying Living Expenses: Not hard at all  Food Insecurity: No Food Insecurity (05/25/2024)   Hunger Vital Sign    Worried About Running Out of Food in the Last Year: Never true    Ran Out of Food in the Last Year: Never true  Transportation Needs: No Transportation Needs (05/25/2024)   PRAPARE - Administrator, Civil Service (Medical): No    Lack of Transportation (Non-Medical): No  Physical Activity: Sufficiently Active (05/25/2024)   Exercise Vital Sign    Days of Exercise per Week: 4 days    Minutes of Exercise per Session: 120 min  Stress: No Stress Concern Present (05/25/2024)   [employer] of Occupational Health - Occupational Stress Questionnaire    Feeling of Stress: Not at all  Social Connections: Socially Integrated (05/25/2024)   Social Connection and Isolation Panel    Frequency of Communication with Friends and Family: More than three times a week    Frequency of Social Gatherings with Friends and Family: More than three times a week    Attends Religious Services: More than 4 times per year    Active Member of [employer] or Organizations: Yes    Attends Engineer, structural: More than 4 times per year    Marital Status: Married    Tobacco Counseling Counseling given: Not Answered    Clinical  Intake:  Pre-visit preparation completed: Yes  Pain : No/denies pain     BMI - recorded: 25.61 Nutritional Status: BMI 25 -29 Overweight Nutritional Risks: None Diabetes: No  Lab Results  Component Value Date   HGBA1C 5.8 10/05/2021   HGBA1C 5.7 (H) 06/22/2020     How often do you need to have someone help you when you read instructions, pamphlets, or other written materials from your doctor or pharmacy?: 1 - Never  Interpreter Needed?: No  Information entered by :: R. Khila Papp LPN   Activities of Daily Living     05/25/2024    3:12 PM  In your present state of health, do you have any difficulty performing the following activities:  Hearing? 0  Vision? 0  Difficulty concentrating or making decisions? 0  Walking or climbing stairs? 0  Dressing or bathing? 0  Doing errands, shopping? 0  Preparing Food and eating ? N  Using the Toilet? N  In the past six months, have you accidently leaked urine? N  Do you have problems with loss of bowel control? N  Managing your Medications? N  Managing your Finances? N  Housekeeping or managing your Housekeeping? N    Patient Care Team: Gretel App, NP as PCP - General (Nurse Practitioner)  I have updated your Care Teams any recent Medical Services you may have received from other providers in the past year.     Assessment:   This is a routine wellness examination for [place].  Hearing/Vision screen Hearing Screening - Comments:: No issues Vision Screening - Comments:: glasses   Goals Addressed             This Visit's Progress    Patient Stated       Wants to continue to exercise       Depression Screen     05/29/2024   11:03 AM 05/28/2023   11:12 AM 03/27/2023    1:03 PM 10/05/2021    9:07 AM 06/15/2020   12:15 PM  PHQ 2/9 Scores  PHQ - 2 Score 0 0 0 0 0  PHQ- 9 Score 0 0 0  0    Fall Risk     05/25/2024    3:12 PM 05/28/2023   11:10 AM 05/20/2023   11:04 AM 03/27/2023    1:03 PM 06/15/2020   11:31 AM  Fall  Risk   Falls in the past year? 0 0 0 0 0  Number falls in past yr: 0 0 0 0   Injury with Fall? 0 0 0 0   Risk for fall due to : No Fall Risks No Fall Risks  No Fall Risks   Follow up Falls evaluation completed;Falls prevention discussed Falls prevention discussed;Falls evaluation completed  Falls evaluation completed Falls evaluation completed      Data saved with a previous flowsheet row definition    MEDICARE RISK AT HOME:  Medicare Risk at Home Any stairs in or around the home?: (Patient-Rptd) Yes If so, are there any without handrails?: (Patient-Rptd) No Home free of loose throw rugs in walkways, pet beds, electrical cords, etc?: (Patient-Rptd) Yes Adequate lighting in your home to reduce risk of falls?: (Patient-Rptd) Yes Life alert?: (Patient-Rptd) No Use of a cane, walker or w/c?: (Patient-Rptd) No Grab bars in the bathroom?: (Patient-Rptd) No Shower chair or bench in shower?: (Patient-Rptd) No Elevated toilet seat or a handicapped toilet?: (Patient-Rptd) No  TIMED UP AND GO:  Was the test performed?  No  Cognitive Function: 6CIT completed        05/29/2024   11:08 AM 05/28/2023   11:17 AM  6CIT Screen  What Year? 0 points 0 points  What month? 0 points 0 points  What time? 0 points 0 points  Count back from 20 0 points 0 points  Months in reverse 0 points 0 points  Repeat phrase 0 points 2 points  Total Score 0 points 2 points    Immunizations Immunization History  Administered Date(s) Administered   Moderna Sars-Covid-2 Vaccination 10/31/2020, 05/16/2021   PFIZER(Purple Top)SARS-COV-2 Vaccination 01/18/2020, 02/15/2020   Tdap 07/05/2020    Screening Tests Health Maintenance  Topic Date Due   Pneumococcal Vaccine: 50+ Years (1 of 1 - PCV) Never done   Zoster Vaccines- Shingrix (1 of 2) Never done   COVID-19 Vaccine (5 - 2024-25 season) 07/07/2023   Medicare Annual Wellness (AWV)  05/27/2024   INFLUENZA VACCINE  06/05/2024  MAMMOGRAM  12/15/2024    DTaP/Tdap/Td (2 - Td or Tdap) 07/05/2030   Colonoscopy  09/02/2030   DEXA SCAN  Completed   Hepatitis C Screening  Completed   Hepatitis B Vaccines  Aged Out   HPV VACCINES  Aged Out   Meningococcal B Vaccine  Aged Out    Health Maintenance  Health Maintenance Due  Topic Date Due   Pneumococcal Vaccine: 50+ Years (1 of 1 - PCV) Never done   Zoster Vaccines- Shingrix (1 of 2) Never done   COVID-19 Vaccine (5 - 2024-25 season) 07/07/2023   Medicare Annual Wellness (AWV)  05/27/2024   Health Maintenance Items Addressed: DEXA ordered Discussed the need to update pneumonia and shingles vaccines, patient will consider these vaccines  Additional Screening:  Vision Screening: Recommended annual ophthalmology exams for early detection of glaucoma and other disorders of the eye. Up to date  Kaitlyn. Carolee Would you like a referral to an eye doctor? No    Dental Screening: Recommended annual dental exams for proper oral hygiene  Community Resource Referral / Chronic Care Management: CRR required this visit?  No   CCM required this visit?  No   Plan:    I have personally reviewed and noted the following in the patient's chart:   Medical and social history Use of alcohol, tobacco or illicit drugs  Current medications and supplements including opioid prescriptions. Patient is not currently taking opioid prescriptions. Functional ability and status Nutritional status Physical activity Advanced directives List of other physicians Hospitalizations, surgeries, and ER visits in previous 12 months Vitals Screenings to include cognitive, depression, and falls Referrals and appointments  In addition, I have reviewed and discussed with patient certain preventive protocols, quality metrics, and best practice recommendations. A written personalized care plan for preventive services as well as general preventive health recommendations were provided to patient.   Angeline Fredericks, LPN   [id number]/74/7974    After Visit Summary: (MyChart) Due to this being a telephonic visit, the after visit summary with patients personalized plan was offered to patient via MyChart   Notes: Nothing significant to report at this time.

## 2024-06-10 ENCOUNTER — Ambulatory Visit
Admission: RE | Admit: 2024-06-10 | Discharge: 2024-06-10 | Disposition: A | Discharge status: Home / Self Care | Source: Ambulatory Visit | Attending: Nurse Practitioner | Admitting: Nurse Practitioner

## 2024-06-10 DIAGNOSIS — M85852 Other specified disorders of bone density and structure, left thigh: Secondary | ICD-10-CM | POA: Diagnosis not present

## 2024-06-10 DIAGNOSIS — Z780 Asymptomatic menopausal state: Secondary | ICD-10-CM | POA: Diagnosis not present

## 2024-06-15 ENCOUNTER — Ambulatory Visit: Payer: Self-pay | Admitting: Nurse Practitioner

## 2024-07-21 ENCOUNTER — Ambulatory Visit: Admitting: Nurse Practitioner

## 2024-07-21 VITALS — BP 108/80 | HR 43 | Temp 98.2°F | Ht 62.0 in | Wt 144.2 lb

## 2024-07-21 DIAGNOSIS — R7303 Prediabetes: Secondary | ICD-10-CM | POA: Diagnosis not present

## 2024-07-21 DIAGNOSIS — M8580 Other specified disorders of bone density and structure, unspecified site: Secondary | ICD-10-CM

## 2024-07-21 DIAGNOSIS — E785 Hyperlipidemia, unspecified: Secondary | ICD-10-CM

## 2024-07-21 DIAGNOSIS — Z1329 Encounter for screening for other suspected endocrine disorder: Secondary | ICD-10-CM

## 2024-07-21 DIAGNOSIS — Z0000 Encounter for general adult medical examination without abnormal findings: Secondary | ICD-10-CM | POA: Diagnosis not present

## 2024-07-21 NOTE — Progress Notes (Signed)
 Leron Glance, NP-C Phone: [phone]  Kaitlyn Forbes is a 68 y.o. female who presents today for annual exam.   Discussed the use of AI scribe software for clinical note transcription with the patient, who gave verbal consent to proceed.  History of Present Illness   Kaitlyn Forbes is a 68 year old female who presents for an annual physical exam.  She has no new problems since her last visit a year ago. She is up to date on her mammogram, colonoscopy, and bone density scan. She reports that her bone density scan did not show osteoporosis. She is taking vitamin D  and calcium supplements.  She has a history of high cholesterol, which is managed through diet and omega-3 supplements. She exercises four to five days a week, including strength training and classes at the gym. Her diet is generally low-carb, and she drinks 110 ounces of water daily.  She has received four COVID-19 vaccinations and a tetanus shot in 2021. She has not yet received the shingles or pneumonia vaccines. She does not smoke, drink alcohol, or use drugs.  No chest pain, shortness of breath, knee or abdominal pain, urinary issues, headaches, dizziness, swallowing difficulties, skin changes, joint pains, mood problems, or sleep disturbances. She reports sleeping well.      Social History   Tobacco Use  Smoking Status Never  Smokeless Tobacco Never    Current Outpatient Medications on File Prior to Visit  Medication Sig Dispense Refill   Ascorbic Acid (VITAMIN C) 1000 MG tablet Take 1,000 mg by mouth daily.     Calcium-Vitamin D -Vitamin K (VIACTIV) 500-500-40 MG-UNT-MCG CHEW Chew by mouth.     Collagen Hydrolysate, Bovine, POWD      finasteride (PROPECIA) 1 MG tablet Take 1 mg by mouth daily.     hydroxychloroquine (PLAQUENIL) 200 MG tablet Take by mouth daily.     minoxidil (LONITEN) 2.5 MG tablet Take by mouth daily.     Multiple Vitamin (MULTIVITAMIN) tablet Take 1 tablet by mouth daily.     NON FORMULARY  Hair vitamin supplement Nutrafol     Omega-3 Fatty Acids (FISH OIL) 1000 MG CAPS Take by mouth.     Probiotic Product (TRUBIOTICS PO) Take by mouth.     No current facility-administered medications on file prior to visit.    ROS see history of present illness  Objective  Physical Exam Vitals:   07/21/24 1033  BP: 108/80  Pulse: (!) 43  Temp: 98.2 F (36.8 C)  SpO2: 97%    BP Readings from Last 3 Encounters:  07/21/24 108/80  03/27/23 118/68  01/08/23 112/65   Wt Readings from Last 3 Encounters:  07/21/24 144 lb 3.2 oz (65.4 kg)  05/29/24 140 lb (63.5 kg)  05/28/23 134 lb 2 oz (60.8 kg)    Physical Exam Constitutional:      General: She is not in acute distress.    Appearance: Normal appearance.  HENT:     Head: Normocephalic.     Right Ear: Tympanic membrane normal.     Left Ear: Tympanic membrane normal.     Nose: Nose normal.     Mouth/Throat:     Mouth: Mucous membranes are moist.     Pharynx: Oropharynx is clear.  Eyes:     Conjunctiva/sclera: Conjunctivae normal.     Pupils: Pupils are equal, round, and reactive to light.  Neck:     Thyroid : No thyromegaly.  Cardiovascular:     Rate and Rhythm: Normal rate and  regular rhythm.     Heart sounds: Normal heart sounds.  Pulmonary:     Effort: Pulmonary effort is normal.     Breath sounds: Normal breath sounds.  Abdominal:     General: Abdomen is flat. Bowel sounds are normal.     Palpations: Abdomen is soft. There is no mass.     Tenderness: There is no abdominal tenderness.  Musculoskeletal:        General: Normal range of motion.  Lymphadenopathy:     Cervical: No cervical adenopathy.  Skin:    General: Skin is warm and dry.     Findings: No rash.  Neurological:     General: No focal deficit present.     Mental Status: She is alert.  Psychiatric:        Mood and Affect: Mood normal.        Behavior: Behavior normal.      Assessment/Plan: Please see individual problem list.  Preventative  health care Assessment & Plan: Physical exam complete. She will return to complete fasting lab work as outlined. Routine visit with no new issues. Pap smear is no longer indicated, s/p hysterectomy. Mammogram and colonoscopy are up to date. Declines the flu vaccine and additional COVID vaccines. Tetanus vaccine is up to date. Politely declines the shingles and pneumonia vaccines. Education provided, recommend getting them at local pharmacy when ready. She does not smoke, drink alcohol or use drugs. Continue routine dental and eye exams. Encourage continued healthy diet and regular exercise. Return to care in one year, sooner as needed.   Orders: -     CBC with Differential/Platelet; Future -     Comprehensive metabolic panel with GFR; Future -     Vitamin B12; Future  Osteopenia, unspecified location Assessment & Plan: Low bone density without osteoporosis. Continue vitamin D  and calcium supplementation. Encourage weight bearing exercises.   Orders: -     VITAMIN D  25 Hydroxy (Vit-D Deficiency, Fractures); Future  Hyperlipidemia, unspecified hyperlipidemia type Assessment & Plan: Managed through diet and omega-3 supplements. Continue. Encourage healthy diet and regular exercise. Check fasting lipids.  Orders: -     Lipid panel; Future  Prediabetes Assessment & Plan: Check A1c. Encourage healthy diet and regular exercise.   Orders: -     Hemoglobin A1c; Future  Thyroid  disorder screen -     TSH; Future     Return for fasting labs then in 1 year for Annual Exam, sooner as needed.   Leron Glance, NP-C [facility]

## 2024-07-31 ENCOUNTER — Other Ambulatory Visit (INDEPENDENT_AMBULATORY_CARE_PROVIDER_SITE_OTHER)

## 2024-07-31 DIAGNOSIS — M8580 Other specified disorders of bone density and structure, unspecified site: Secondary | ICD-10-CM | POA: Diagnosis not present

## 2024-07-31 DIAGNOSIS — Z1329 Encounter for screening for other suspected endocrine disorder: Secondary | ICD-10-CM

## 2024-07-31 DIAGNOSIS — E785 Hyperlipidemia, unspecified: Secondary | ICD-10-CM | POA: Diagnosis not present

## 2024-07-31 DIAGNOSIS — R7303 Prediabetes: Secondary | ICD-10-CM | POA: Diagnosis not present

## 2024-07-31 DIAGNOSIS — Z0000 Encounter for general adult medical examination without abnormal findings: Secondary | ICD-10-CM

## 2024-07-31 LAB — COMPREHENSIVE METABOLIC PANEL WITH GFR
ALT: 13 U/L (ref 0–35)
AST: 21 U/L (ref 0–37)
Albumin: 4 g/dL (ref 3.5–5.2)
Alkaline Phosphatase: 49 U/L (ref 39–117)
BUN: 18 mg/dL (ref 6–23)
CO2: 31 meq/L (ref 19–32)
Calcium: 9.1 mg/dL (ref 8.4–10.5)
Chloride: 100 meq/L (ref 96–112)
Creatinine, Ser: 1.03 mg/dL (ref 0.40–1.20)
GFR: 56.32 mL/min — ABNORMAL LOW (ref >60.00)
Glucose, Bld: 91 mg/dL (ref 70–99)
Potassium: 3.8 meq/L (ref 3.5–5.1)
Sodium: 137 meq/L (ref 135–145)
Total Bilirubin: 0.6 mg/dL (ref 0.2–1.2)
Total Protein: 6.8 g/dL (ref 6.0–8.3)

## 2024-07-31 LAB — CBC WITH DIFFERENTIAL/PLATELET
Basophils Absolute: 0 K/uL (ref 0.0–0.1)
Basophils Relative: 0.8 % (ref 0.0–3.0)
Eosinophils Absolute: 0.1 K/uL (ref 0.0–0.7)
Eosinophils Relative: 2.3 % (ref 0.0–5.0)
HCT: 42.1 % (ref 36.0–46.0)
Hemoglobin: 14.3 g/dL (ref 12.0–15.0)
Lymphocytes Relative: 45.7 % (ref 12.0–46.0)
Lymphs Abs: 1.9 K/uL (ref 0.7–4.0)
MCHC: 34 g/dL (ref 30.0–36.0)
MCV: 80.4 fl (ref 78.0–100.0)
Monocytes Absolute: 0.4 K/uL (ref 0.1–1.0)
Monocytes Relative: 10 % (ref 3.0–12.0)
Neutro Abs: 1.7 K/uL (ref 1.4–7.7)
Neutrophils Relative %: 41.2 % — ABNORMAL LOW (ref 43.0–77.0)
Platelets: 215 K/uL (ref 150.0–400.0)
RBC: 5.24 Mil/uL — ABNORMAL HIGH (ref 3.87–5.11)
RDW: 14.3 % (ref 11.5–15.5)
WBC: 4.2 K/uL (ref 4.0–10.5)

## 2024-07-31 LAB — LIPID PANEL
Cholesterol: 195 mg/dL (ref 0–200)
HDL: 68.2 mg/dL (ref >39.00)
LDL Cholesterol: 116 mg/dL — ABNORMAL HIGH (ref 0–99)
NonHDL: 126.79
Total CHOL/HDL Ratio: 3
Triglycerides: 52 mg/dL (ref 0.0–149.0)
VLDL: 10.4 mg/dL (ref 0.0–40.0)

## 2024-07-31 LAB — HEMOGLOBIN A1C: Hgb A1c MFr Bld: 6.3 % (ref 4.6–6.5)

## 2024-07-31 LAB — VITAMIN D 25 HYDROXY (VIT D DEFICIENCY, FRACTURES): VITD: 28.26 ng/mL — ABNORMAL LOW (ref 30.00–100.00)

## 2024-07-31 LAB — VITAMIN B12: Vitamin B-12: 856 pg/mL (ref 211–911)

## 2024-07-31 LAB — TSH: TSH: 0.83 u[IU]/mL (ref 0.35–5.50)

## 2024-08-04 ENCOUNTER — Encounter: Payer: Self-pay | Admitting: Nurse Practitioner

## 2024-08-04 NOTE — Assessment & Plan Note (Addendum)
 Managed through diet and omega-3 supplements. Continue. Encourage healthy diet and regular exercise. Check fasting lipids.

## 2024-08-04 NOTE — Assessment & Plan Note (Signed)
 Low bone density without osteoporosis. Continue vitamin D  and calcium supplementation. Encourage weight bearing exercises.

## 2024-08-04 NOTE — Assessment & Plan Note (Signed)
 Physical exam complete. She will return to complete fasting lab work as outlined. Routine visit with no new issues. Pap smear is no longer indicated, s/p hysterectomy. Mammogram and colonoscopy are up to date. Declines the flu vaccine and additional COVID vaccines. Tetanus vaccine is up to date. Politely declines the shingles and pneumonia vaccines. Education provided, recommend getting them at local pharmacy when ready. She does not smoke, drink alcohol or use drugs. Continue routine dental and eye exams. Encourage continued healthy diet and regular exercise. Return to care in one year, sooner as needed.

## 2024-08-04 NOTE — Assessment & Plan Note (Signed)
 Check A1c. Encourage healthy diet and regular exercise.

## 2024-08-07 ENCOUNTER — Ambulatory Visit: Payer: Self-pay | Admitting: Nurse Practitioner

## 2024-08-18 DIAGNOSIS — H25813 Combined forms of age-related cataract, bilateral: Secondary | ICD-10-CM | POA: Diagnosis not present

## 2024-08-19 ENCOUNTER — Ambulatory Visit (INDEPENDENT_AMBULATORY_CARE_PROVIDER_SITE_OTHER)

## 2024-08-19 ENCOUNTER — Ambulatory Visit: Admitting: Podiatry

## 2024-08-19 ENCOUNTER — Ambulatory Visit

## 2024-08-19 DIAGNOSIS — M7740 Metatarsalgia, unspecified foot: Secondary | ICD-10-CM | POA: Diagnosis not present

## 2024-08-19 DIAGNOSIS — L603 Nail dystrophy: Secondary | ICD-10-CM | POA: Diagnosis not present

## 2024-08-19 DIAGNOSIS — M7741 Metatarsalgia, right foot: Secondary | ICD-10-CM

## 2024-08-19 MED ORDER — MELOXICAM 15 MG PO TABS: 15.0000 mg | ORAL_TABLET | Freq: Every day | ORAL | 0 refills | Status: DC

## 2024-08-19 MED ORDER — METHYLPREDNISOLONE 4 MG PO TBPK: ORAL_TABLET | ORAL | 0 refills | Status: AC

## 2024-08-19 NOTE — Progress Notes (Unsigned)
 Subjective:  Patient ID: Kaitlyn Forbes, female    DOB: July 27, 1956,  MRN: [id number] HPI Chief Complaint  Patient presents with   Foot Pain    Swelling on bottom of foot    68 y.o. female presents with the above complaint.   ROS: Denies fever chills nausea mobic muscle aches pains calf pain back pain chest pain shortness of breath.  Past Medical History:  Diagnosis Date   Diverticulosis    Dysuria 10/05/2021   Fibroids    Migraines    Osteoarthritis    right knee   Screening for HIV (human immunodeficiency virus) 06/15/2020   Past Surgical History:  Procedure Laterality Date   ABDOMINAL HYSTERECTOMY  02/1997   TAH for fibroids/ Dr Hazel   COLONOSCOPY  09/01/2010   diverticulosis   COLONOSCOPY WITH PROPOFOL  N/A 09/02/2020   Procedure: COLONOSCOPY WITH PROPOFOL ;  Surgeon: Therisa Bi, MD;  Location: [facility] ENDOSCOPY;  Service: Gastroenterology;  Laterality: N/A;   ECTOPIC PREGNANCY SURGERY Left 11/1990   with left salpingectomy   ESOPHAGOGASTRODUODENOSCOPY  09/01/2010   Dr Luellen. Prior EGD for anemia work up by Dr Chipper.   HYSTEROSALPINGOGRAM  01/1991   distal obstruction of right Fallopian tube   INCISION AND DRAINAGE     left bartholin cyst/ Word catheter    Current Outpatient Medications:    meloxicam (MOBIC) 15 MG tablet, Take 1 tablet (15 mg total) by mouth daily., Disp: 30 tablet, Rfl: 0   methylPREDNISolone (MEDROL DOSEPAK) 4 MG TBPK tablet, Take 6 tabs the 1st day, take 5 tabs the 2nd day, take 4 tabs the 3rd day, take 3 tabs the 4th day, take 2 tabs the 5th day, and take 1 tab the 6th day, Disp: 21 tablet, Rfl: 0   Ascorbic Acid (VITAMIN C) 1000 MG tablet, Take 1,000 mg by mouth daily., Disp: , Rfl:    Calcium-Vitamin D -Vitamin K (VIACTIV) 500-500-40 MG-UNT-MCG CHEW, Chew by mouth., Disp: , Rfl:    Collagen Hydrolysate, Bovine, POWD, , Disp: , Rfl:    finasteride (PROPECIA) 1 MG tablet, Take 1 mg by mouth daily., Disp: , Rfl:    hydroxychloroquine  (PLAQUENIL) 200 MG tablet, Take by mouth daily., Disp: , Rfl:    minoxidil (LONITEN) 2.5 MG tablet, Take by mouth daily., Disp: , Rfl:    Multiple Vitamin (MULTIVITAMIN) tablet, Take 1 tablet by mouth daily., Disp: , Rfl:    NON FORMULARY, Hair vitamin supplement Nutrafol, Disp: , Rfl:    Omega-3 Fatty Acids (FISH OIL) 1000 MG CAPS, Take by mouth., Disp: , Rfl:    Probiotic Product (TRUBIOTICS PO), Take by mouth., Disp: , Rfl:   Allergies  Allergen Reactions   Aspirin     Other Reaction(s): Not available   Review of Systems Objective:  There were no vitals filed for this visit.  General: Well developed, nourished, in no acute distress, alert and oriented x3   Dermatological: Skin is warm, dry and supple bilateral. Nails x 10 are well maintained; remaining integument appears unremarkable at this time. There are no open sores, no preulcerative lesions, no rash or signs of infection present.  Vascular: Dorsalis Pedis artery and Posterior Tibial artery pedal pulses are 2/4 bilateral with immedate capillary fill time. Pedal hair growth present. No varicosities and no lower extremity edema present bilateral.   Neruologic: Grossly intact via light touch bilateral. Vibratory intact via tuning fork bilateral. Protective threshold with Semmes Wienstein monofilament intact to all pedal sites bilateral. Patellar and Achilles deep tendon reflexes 2+  bilateral. No Babinski or clonus noted bilateral.   Musculoskeletal: No gross boney pedal deformities bilateral. No pain, crepitus, or limitation noted with foot and ankle range of motion bilateral. Muscular strength 5/5 in all groups tested bilateral.  Plantar fibroma plantar medial aspect of the medial band of the plantar fascia right foot largest area measuring about a centimeter in diameter to a small area of about half a centimeter in diameter proximally.  Nontender on palpation nonpulsatile mass.  Gait: Unassisted, Nonantalgic.     Radiographs:  Radiographs taken today demonstrate osseously mature individual with some osteoarthritic changes to the forefoot and midfoot.  Osteopenia is also noted.  No intrasubstance calcification of the lesion in question.  Assessment & Plan:   Assessment: Nonpainful plantar fibroma right.  Plan: Discussed the need for injection or excision should this become painful.  Follow-up with us  as needed     Breeanna Galgano T. [place], [place]

## 2024-09-14 DIAGNOSIS — H2513 Age-related nuclear cataract, bilateral: Secondary | ICD-10-CM | POA: Diagnosis not present

## 2024-09-14 DIAGNOSIS — H2512 Age-related nuclear cataract, left eye: Secondary | ICD-10-CM | POA: Diagnosis not present

## 2024-09-14 DIAGNOSIS — H25013 Cortical age-related cataract, bilateral: Secondary | ICD-10-CM | POA: Diagnosis not present

## 2024-09-17 ENCOUNTER — Other Ambulatory Visit: Payer: Self-pay | Admitting: Podiatry

## 2024-11-02 ENCOUNTER — Other Ambulatory Visit: Payer: Self-pay | Admitting: Nurse Practitioner

## 2024-11-02 DIAGNOSIS — Z1231 Encounter for screening mammogram for malignant neoplasm of breast: Secondary | ICD-10-CM

## 2024-12-16 ENCOUNTER — Encounter

## 2025-05-31 ENCOUNTER — Ambulatory Visit

## 2025-07-22 ENCOUNTER — Encounter: Admitting: Nurse Practitioner
